# Patient Record
Sex: Female | Born: 1990 | Race: White | Hispanic: No | Marital: Married | Smoking: Former smoker
Health system: Southern US, Community
[De-identification: ages and names within clinical notes are randomized; demographics above are authoritative.]

## PROBLEM LIST (undated history)

## (undated) DIAGNOSIS — F1910 Other psychoactive substance abuse, uncomplicated: Secondary | ICD-10-CM

## (undated) DIAGNOSIS — F1020 Alcohol dependence, uncomplicated: Secondary | ICD-10-CM

## (undated) DIAGNOSIS — T7840XA Allergy, unspecified, initial encounter: Secondary | ICD-10-CM

## (undated) DIAGNOSIS — K219 Gastro-esophageal reflux disease without esophagitis: Secondary | ICD-10-CM

## (undated) DIAGNOSIS — IMO0001 Reserved for inherently not codable concepts without codable children: Secondary | ICD-10-CM

## (undated) HISTORY — DX: Other psychoactive substance abuse, uncomplicated: F19.10

## (undated) HISTORY — DX: Allergy, unspecified, initial encounter: T78.40XA

## (undated) HISTORY — DX: Reserved for inherently not codable concepts without codable children: IMO0001

## (undated) HISTORY — PX: WISDOM TOOTH EXTRACTION: SHX21

## (undated) HISTORY — PX: HERNIA REPAIR: SHX51

## (undated) HISTORY — PX: CYST EXCISION: SHX5701

## (undated) HISTORY — DX: Gastro-esophageal reflux disease without esophagitis: K21.9

## (undated) HISTORY — DX: Alcohol dependence, uncomplicated: F10.20

## (undated) HISTORY — PX: TONSILLECTOMY: SUR1361

---
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
## 2016-09-21 LAB — OB RESULTS CONSOLE HEPATITIS B SURFACE ANTIGEN
HEP B S AG: NEGATIVE
HEP B S AG: NEGATIVE

## 2016-09-21 LAB — OB RESULTS CONSOLE GC/CHLAMYDIA
Chlamydia: NEGATIVE
Gonorrhea: NEGATIVE

## 2016-09-21 LAB — OB RESULTS CONSOLE HIV ANTIBODY (ROUTINE TESTING): HIV: NONREACTIVE

## 2016-09-21 LAB — OB RESULTS CONSOLE RUBELLA ANTIBODY, IGM: Rubella: IMMUNE

## 2016-09-21 LAB — OB RESULTS CONSOLE RPR: RPR: NONREACTIVE

## 2017-01-22 LAB — OB RESULTS CONSOLE HEPATITIS B SURFACE ANTIGEN: Hepatitis B Surface Ag: NEGATIVE

## 2017-04-24 ENCOUNTER — Encounter (HOSPITAL_COMMUNITY): Payer: Self-pay

## 2017-04-24 ENCOUNTER — Inpatient Hospital Stay (HOSPITAL_COMMUNITY): Payer: 59 | Admitting: Anesthesiology

## 2017-04-24 ENCOUNTER — Inpatient Hospital Stay (HOSPITAL_COMMUNITY)
Admission: AD | Admit: 2017-04-24 | Discharge: 2017-04-26 | DRG: 807 | Disposition: A | Discharge status: Home / Self Care | Payer: 59 | Source: Ambulatory Visit | Attending: Obstetrics and Gynecology | Admitting: Obstetrics and Gynecology

## 2017-04-24 DIAGNOSIS — Z3A38 38 weeks gestation of pregnancy: Secondary | ICD-10-CM

## 2017-04-24 DIAGNOSIS — Z3483 Encounter for supervision of other normal pregnancy, third trimester: Secondary | ICD-10-CM | POA: Diagnosis present

## 2017-04-24 LAB — CBC
HCT: 33.9 % — ABNORMAL LOW (ref 36.0–46.0)
HEMOGLOBIN: 11.5 g/dL — AB (ref 12.0–15.0)
MCH: 31.3 pg (ref 26.0–34.0)
MCHC: 33.9 g/dL (ref 30.0–36.0)
MCV: 92.4 fL (ref 78.0–100.0)
PLATELETS: 265 10*3/uL (ref 150–400)
RBC: 3.67 MIL/uL — AB (ref 3.87–5.11)
RDW: 12.5 % (ref 11.5–15.5)
WBC: 8.1 10*3/uL (ref 4.0–10.5)

## 2017-04-24 LAB — TYPE AND SCREEN
ABO/RH(D): O POS
ANTIBODY SCREEN: NEGATIVE

## 2017-04-24 LAB — ABO/RH: ABO/RH(D): O POS

## 2017-04-24 LAB — POCT FERN TEST: POCT Fern Test: POSITIVE

## 2017-04-24 MED ORDER — SOD CITRATE-CITRIC ACID 500-334 MG/5ML PO SOLN: 30.0000 mL | ORAL | Status: DC | PRN

## 2017-04-24 MED ORDER — DIPHENHYDRAMINE HCL 25 MG PO CAPS: 25.0000 mg | ORAL_CAPSULE | Freq: Four times a day (QID) | ORAL | Status: DC | PRN

## 2017-04-24 MED ORDER — DIPHENHYDRAMINE HCL 50 MG/ML IJ SOLN: 12.5000 mg | INTRAMUSCULAR | Status: DC | PRN

## 2017-04-24 MED ORDER — EPHEDRINE 5 MG/ML INJ
10.0000 mg | INTRAVENOUS | Status: DC | PRN
  Filled 2017-04-24: qty 2

## 2017-04-24 MED ORDER — FLEET ENEMA 7-19 GM/118ML RE ENEM: 1.0000 | ENEMA | Freq: Every day | RECTAL | Status: DC | PRN

## 2017-04-24 MED ORDER — SENNOSIDES-DOCUSATE SODIUM 8.6-50 MG PO TABS
2.0000 | ORAL_TABLET | ORAL | Status: DC
  Administered 2017-04-24 – 2017-04-25 (×2): 2 via ORAL
  Filled 2017-04-24 (×2): qty 2

## 2017-04-24 MED ORDER — OXYTOCIN 40 UNITS IN LACTATED RINGERS INFUSION - SIMPLE MED: 2.5000 [IU]/h | INTRAVENOUS | Status: DC

## 2017-04-24 MED ORDER — OXYTOCIN 40 UNITS IN LACTATED RINGERS INFUSION - SIMPLE MED
1.0000 m[IU]/min | INTRAVENOUS | Status: DC
  Administered 2017-04-24: 2 m[IU]/min via INTRAVENOUS
  Filled 2017-04-24: qty 1000

## 2017-04-24 MED ORDER — ACETAMINOPHEN 325 MG PO TABS
650.0000 mg | ORAL_TABLET | ORAL | Status: DC | PRN
  Administered 2017-04-24: 650 mg via ORAL
  Filled 2017-04-24 (×2): qty 2

## 2017-04-24 MED ORDER — LACTATED RINGERS IV SOLN: 500.0000 mL | INTRAVENOUS | Status: DC | PRN

## 2017-04-24 MED ORDER — FENTANYL 2.5 MCG/ML BUPIVACAINE 1/10 % EPIDURAL INFUSION (WH - ANES)
14.0000 mL/h | INTRAMUSCULAR | Status: DC | PRN
  Administered 2017-04-24: 14 mL/h via EPIDURAL
  Filled 2017-04-24: qty 100

## 2017-04-24 MED ORDER — TETANUS-DIPHTH-ACELL PERTUSSIS 5-2.5-18.5 LF-MCG/0.5 IM SUSP: 0.5000 mL | Freq: Once | INTRAMUSCULAR | Status: DC

## 2017-04-24 MED ORDER — OXYTOCIN BOLUS FROM INFUSION
500.0000 mL | Freq: Once | INTRAVENOUS | Status: AC
  Administered 2017-04-24: 500 mL via INTRAVENOUS

## 2017-04-24 MED ORDER — SIMETHICONE 80 MG PO CHEW: 80.0000 mg | CHEWABLE_TABLET | ORAL | Status: DC | PRN

## 2017-04-24 MED ORDER — MEASLES, MUMPS & RUBELLA VAC ~~LOC~~ INJ
0.5000 mL | INJECTION | Freq: Once | SUBCUTANEOUS | Status: DC
  Filled 2017-04-24: qty 0.5

## 2017-04-24 MED ORDER — BISACODYL 10 MG RE SUPP: 10.0000 mg | Freq: Every day | RECTAL | Status: DC | PRN

## 2017-04-24 MED ORDER — OXYCODONE-ACETAMINOPHEN 5-325 MG PO TABS: 2.0000 | ORAL_TABLET | ORAL | Status: DC | PRN

## 2017-04-24 MED ORDER — PENICILLIN G POTASSIUM 5000000 UNITS IJ SOLR
5.0000 10*6.[IU] | Freq: Once | INTRAVENOUS | Status: AC
  Administered 2017-04-24: 5 10*6.[IU] via INTRAVENOUS
  Filled 2017-04-24: qty 5

## 2017-04-24 MED ORDER — ONDANSETRON HCL 4 MG PO TABS: 4.0000 mg | ORAL_TABLET | ORAL | Status: DC | PRN

## 2017-04-24 MED ORDER — LACTATED RINGERS IV SOLN
INTRAVENOUS | Status: DC
  Administered 2017-04-24: 05:00:00 via INTRAVENOUS
  Administered 2017-04-24: 125 mL/h via INTRAVENOUS

## 2017-04-24 MED ORDER — OXYCODONE-ACETAMINOPHEN 5-325 MG PO TABS: 1.0000 | ORAL_TABLET | ORAL | Status: DC | PRN

## 2017-04-24 MED ORDER — LACTATED RINGERS IV SOLN: 500.0000 mL | Freq: Once | INTRAVENOUS | Status: DC

## 2017-04-24 MED ORDER — OXYCODONE-ACETAMINOPHEN 5-325 MG PO TABS
1.0000 | ORAL_TABLET | ORAL | Status: DC | PRN
  Administered 2017-04-25 – 2017-04-26 (×4): 1 via ORAL
  Filled 2017-04-24 (×4): qty 1

## 2017-04-24 MED ORDER — PENICILLIN G POT IN DEXTROSE 60000 UNIT/ML IV SOLN
3.0000 10*6.[IU] | INTRAVENOUS | Status: DC
  Filled 2017-04-24 (×2): qty 50

## 2017-04-24 MED ORDER — PHENYLEPHRINE 40 MCG/ML (10ML) SYRINGE FOR IV PUSH (FOR BLOOD PRESSURE SUPPORT)
80.0000 ug | PREFILLED_SYRINGE | INTRAVENOUS | Status: DC | PRN
  Filled 2017-04-24: qty 10
  Filled 2017-04-24: qty 5

## 2017-04-24 MED ORDER — TERBUTALINE SULFATE 1 MG/ML IJ SOLN
0.2500 mg | Freq: Once | INTRAMUSCULAR | Status: DC | PRN
  Filled 2017-04-24: qty 1

## 2017-04-24 MED ORDER — PRENATAL MULTIVITAMIN CH
1.0000 | ORAL_TABLET | Freq: Every day | ORAL | Status: DC
  Administered 2017-04-24 – 2017-04-25 (×2): 1 via ORAL
  Filled 2017-04-24 (×2): qty 1

## 2017-04-24 MED ORDER — PHENYLEPHRINE 40 MCG/ML (10ML) SYRINGE FOR IV PUSH (FOR BLOOD PRESSURE SUPPORT)
80.0000 ug | PREFILLED_SYRINGE | INTRAVENOUS | Status: DC | PRN
  Administered 2017-04-24: 80 ug via INTRAVENOUS
  Filled 2017-04-24: qty 5

## 2017-04-24 MED ORDER — BENZOCAINE-MENTHOL 20-0.5 % EX AERO
1.0000 "application " | INHALATION_SPRAY | CUTANEOUS | Status: DC | PRN
  Administered 2017-04-24: 1 via TOPICAL
  Filled 2017-04-24: qty 56

## 2017-04-24 MED ORDER — ACETAMINOPHEN 325 MG PO TABS
650.0000 mg | ORAL_TABLET | ORAL | Status: DC | PRN
Start: 2017-04-24 — End: 2017-04-24

## 2017-04-24 MED ORDER — IBUPROFEN 800 MG PO TABS
800.0000 mg | ORAL_TABLET | Freq: Three times a day (TID) | ORAL | Status: DC | PRN
  Administered 2017-04-24 – 2017-04-26 (×5): 800 mg via ORAL
  Filled 2017-04-24 (×7): qty 1

## 2017-04-24 MED ORDER — COCONUT OIL OIL: 1.0000 "application " | TOPICAL_OIL | Status: DC | PRN

## 2017-04-24 MED ORDER — FLEET ENEMA 7-19 GM/118ML RE ENEM: 1.0000 | ENEMA | RECTAL | Status: DC | PRN

## 2017-04-24 MED ORDER — ONDANSETRON HCL 4 MG/2ML IJ SOLN: 4.0000 mg | Freq: Four times a day (QID) | INTRAMUSCULAR | Status: DC | PRN

## 2017-04-24 MED ORDER — WITCH HAZEL-GLYCERIN EX PADS
1.0000 "application " | MEDICATED_PAD | CUTANEOUS | Status: DC | PRN
  Administered 2017-04-25: 1 via TOPICAL

## 2017-04-24 MED ORDER — ZOLPIDEM TARTRATE 5 MG PO TABS: 5.0000 mg | ORAL_TABLET | Freq: Every evening | ORAL | Status: DC | PRN

## 2017-04-24 MED ORDER — LIDOCAINE HCL (PF) 1 % IJ SOLN
30.0000 mL | INTRAMUSCULAR | Status: DC | PRN
  Filled 2017-04-24: qty 30

## 2017-04-24 MED ORDER — LIDOCAINE HCL (PF) 1 % IJ SOLN
INTRAMUSCULAR | Status: DC | PRN
  Administered 2017-04-24: 13 mL via EPIDURAL

## 2017-04-24 MED ORDER — DIBUCAINE 1 % RE OINT: 1.0000 "application " | TOPICAL_OINTMENT | RECTAL | Status: DC | PRN

## 2017-04-24 MED ORDER — ONDANSETRON HCL 4 MG/2ML IJ SOLN: 4.0000 mg | INTRAMUSCULAR | Status: DC | PRN

## 2017-04-24 NOTE — Anesthesia Postprocedure Evaluation (Signed)
Anesthesia Post Note  Patient: Alicia Elliott  Procedure(s) Performed: AN AD HOC LABOR EPIDURAL     Patient location during evaluation: Mother Baby Anesthesia Type: Epidural Level of consciousness: awake and alert and oriented Pain management: pain level controlled Vital Signs Assessment: post-procedure vital signs reviewed and stable Respiratory status: spontaneous breathing and nonlabored ventilation Cardiovascular status: stable Postop Assessment: no headache, patient able to bend at knees, no backache, no apparent nausea or vomiting, epidural receding and adequate PO intake Anesthetic complications: no    Last Vitals:  Vitals:   04/24/17 1100 04/24/17 1200  BP: 124/70 124/69  Pulse: 92 89  Resp: 18 17  Temp: 36.9 C 37.2 C  SpO2: 100% 100%    Last Pain:  Vitals:   04/24/17 1200  TempSrc: Oral  PainSc: 0-No pain   Pain Goal:                 Land O'LakesMalinova,Alicia Elliott

## 2017-04-24 NOTE — Anesthesia Postprocedure Evaluation (Signed)
Anesthesia Post Note  Patient: Alicia RaisinCarrie Common  Procedure(s) Performed: AN AD HOC LABOR EPIDURAL     Patient location during evaluation: Mother Baby Anesthesia Type: Epidural Level of consciousness: awake Pain management: pain level controlled Vital Signs Assessment: post-procedure vital signs reviewed and stable Respiratory status: spontaneous breathing Cardiovascular status: stable Postop Assessment: no headache, no backache, epidural receding, patient able to bend at knees, no apparent nausea or vomiting and adequate PO intake Anesthetic complications: no    Last Vitals:  Vitals:   04/24/17 1100 04/24/17 1200  BP: 124/70 124/69  Pulse: 92 89  Resp: 18 17  Temp: 36.9 C 37.2 C  SpO2: 100% 100%    Last Pain:  Vitals:   04/24/17 1200  TempSrc: Oral  PainSc: 0-No pain   Pain Goal:                 [name]

## 2017-04-24 NOTE — MAU Note (Signed)
Pt states water broke at 0315-clear fluid. Pt reports contractions about every 5 mins. Pt denies vaginal bleeding. Reports good fetal movement. States she was 1.5cm last week. +GBS

## 2017-04-24 NOTE — Lactation Note (Signed)
This note was copied from a baby's chart. Lactation Consultation Note  Patient Name: Alicia Elliott: 04/24/2017 Reason for consult: Initial assessment;Early term 37-38.6wks;Other (Comment)(LC noted the baby to have a short anterior frenulum , latched with depth , and Did Not discuss with parents - worked on spoon feeding and latching . compressible areolas with some edema - se LC note )  Baby is 6 hours old  [facility]  Mom with hand expressing with good results.  Baby awake and hungry, spoon fed 2.5 ml/ and tolerated well. LC noted a short anterior frenulum.  Since the baby was able to spoon feed well and latch did not discuss tongue - tie with them.  After spoon feeding, LC had baby suck on her gloved finger, and noted the baby to also have a high palate,  Upper lip flanges well with assessment and latched at the breast. Baby intermittently was humping the back of her tongue, settled down as she got into a pattern.  Baby latched well with depth, multiple swallows noted, and increased with breast compressions.  Per mom comfortable.  LC instructed mom on the use shells due to some swelling noted at the base if the nipple when areola  Compressible. Explained to mom to wear shells between feedings when not STS, and not when sleeping.  Discussed prevention of soreness.  Also recommended prior to feeding on the 1st breast , breast massage, hand express, pre-pump to make the  Nipple and areola more compressible and elastic and then reverse pressure ( as shown )  Firm support for latching.  Mother informed of post-discharge support and given phone number to the lactation department, including services for phone call assistance; out-patient appointments; and breastfeeding support group. List of other breastfeeding resources in the community given in the handout. Encouraged mother to call for problems or concerns related to breastfeeding.  RN aware of  findings at consult and [facility].    Maternal Data Has patient been taught Hand Expression?: Yes(as LC entered the room MBURN  hand expressing 2.5 , LC spoon fed baby ) Does the patient have breastfeeding experience prior to this delivery?: No  Feeding Feeding Type: Breast Fed Length of feed: 7 min(multiple swallows noted , increased w/ breast compressions )  LATCH Score Latch: Grasps breast easily, tongue down, lips flanged, rhythmical sucking.(worked on depth )  Audible Swallowing: Spontaneous and intermittent  Type of Nipple: Everted at rest and after stimulation  Comfort (Breast/Nipple): Soft / non-tender  Hold (Positioning): Assistance needed to correctly position infant at breast and maintain latch.  LATCH Score: 9  Interventions Interventions: Breast feeding basics reviewed;Assisted with latch;Skin to skin;Breast massage;Hand express;Pre-pump if needed;Breast compression;Adjust position;Support pillows;Position options;Shells;Hand pump  Lactation Tools Discussed/Used Tools: Shells;Pump Shell Type: Inverted Breast pump type: Manual Pump Review: Setup, frequency, and cleaning Initiated by:: MAI - after breast massage , hand express, pre-pump to make the nipple / areola complex more elastic to prevent soreness  Elliott initiated:: 04/24/17   Consult Status Consult Status: Follow-up Elliott: 04/25/17 Follow-up type: In-patient    Matilde SprangMargaret Ann Stefanie Hodgens 04/24/2017, 4:05 PM

## 2017-04-24 NOTE — L&D Delivery Note (Signed)
Delivery Note At 9:11 AM a viable female was delivered via Vaginal, Spontaneous (Presentation:VTX>>OA ;  ).  APGAR: 9, 9; weight  .   Placenta status:SPONT>>INTACT , .  Cord:  with the following complications: .  Cord pH: not sent  Anesthesia:  epid Episiotomy: None Lacerations: Periurethral Suture Repair: 3.0 vicryl rapide Est. Blood Loss (mL):   300 Mom to postpartum.  Baby to Couplet care / Skin to Skin.  [name] [name] Adrianne Shackleton 04/24/2017, 9:36 AM

## 2017-04-24 NOTE — H&P (Signed)
Alicia Elliott is a 27 y.o. female presenting for SROM + labor. OB History    Gravida Para Term Preterm AB Living   2       1     SAB TAB Ectopic Multiple Live Births   1             History reviewed. No pertinent past medical history. Past Surgical History:  Procedure Laterality Date  . CYST EXCISION    . HERNIA REPAIR    . TONSILLECTOMY    . WISDOM TOOTH EXTRACTION     Family History: family history is not on file. Social History:  reports that  has never smoked. she has never used smokeless tobacco. She reports that she does not drink alcohol or use drugs.     Maternal Diabetes: No Genetic Screening: Normal Maternal Ultrasounds/Referrals: Normal Fetal Ultrasounds or other Referrals:  None Maternal Substance Abuse:  No Significant Maternal Medications:  None Significant Maternal Lab Results:  None Other Comments:  None  ROS History Dilation: (P) 10 Effacement (%): 100 Station: (P) +1, +2 Exam by:: (P) Alicia Elliott Blood pressure (!) 97/48, pulse 88, temperature 97.7 F (36.5 C), temperature source Oral, resp. rate 18, height 5\' 7"  (1.702 m), weight 169 lb (76.7 kg), SpO2 99 %. Exam Physical Exam  Constitutional: She is oriented to person, place, and time. She appears well-developed and well-nourished.  HENT:  Head: Normocephalic and atraumatic.  Neck: Normal range of motion. Neck supple.  Cardiovascular: Normal rate and regular rhythm.  Respiratory: Effort normal and breath sounds normal.  GI:  Term FH, FHR 142  Genitourinary:  Genitourinary Comments: Now C/C/+2  Musculoskeletal: Normal range of motion.  Neurological: She is alert and oriented to person, place, and time.  Skin: Skin is dry.    Prenatal labs: ABO, Rh: --/--/O POS (01/01 [id number]) Antibody: NEG (01/01 0438) Rubella:   RPR:    HBsAg:    HIV:    GBS:     Assessment/Plan: Term IUP, labor   Alicia Elliott 04/24/2017, 8:30 AM

## 2017-04-24 NOTE — Anesthesia Pain Management Evaluation Note (Signed)
  CRNA Pain Management Visit Note  Patient: Alicia Elliott, 27 y.o., female  "Hello I am a member of the anesthesia team at [facility]. We have an anesthesia team available at all times to provide care throughout the hospital, including epidural management and anesthesia for C-section. I don't know your plan for the delivery whether it a natural birth, water birth, IV sedation, nitrous supplementation, doula or epidural, but we want to meet your pain goals."   1.Was your pain managed to your expectations on prior hospitalizations?   Yes   2.What is your expectation for pain management during this hospitalization?     Epidural  3.How can we help you reach that goal? Verbalizes epidural  Record the patient's initial score and the patient's pain goal.   Pain: 1  Pain Goal: 6 [facility] wants you to be able to say your pain was always managed very well.  Mauricia AreaWINN,Ceili Boshers 04/24/2017

## 2017-04-24 NOTE — Anesthesia Procedure Notes (Signed)
Epidural Patient location during procedure: OB Start time: 04/24/2017 6:26 AM End time: 04/24/2017 6:41 AM  Staffing Anesthesiologist: Lowella CurbMiller, Fleming Prill Ray, MD Performed: anesthesiologist   Preanesthetic Checklist Completed: patient identified, site marked, surgical consent, pre-op evaluation, timeout performed, IV checked, risks and benefits discussed and monitors and equipment checked  Epidural Patient position: sitting Prep: ChloraPrep Patient monitoring: heart rate, cardiac monitor, continuous pulse ox and blood pressure Approach: midline Location: L2-L3 Injection technique: LOR saline  Needle:  Needle type: Tuohy  Needle gauge: 17 G Needle length: 9 cm Needle insertion depth: 5 cm Catheter type: closed end flexible Catheter size: 20 Guage Catheter at skin depth: 9 cm Test dose: negative  Assessment Events: blood not aspirated, injection not painful, no injection resistance, negative IV test and no paresthesia  Additional Notes Reason for block:procedure for pain

## 2017-04-24 NOTE — Anesthesia Preprocedure Evaluation (Signed)
Anesthesia Evaluation  Patient identified by MRN, date of birth, ID band Patient awake    Reviewed: Allergy & Precautions, NPO status , Patient's Chart, lab work & pertinent test results  Airway Mallampati: II  TM Distance: >3 FB Neck ROM: Full    Dental no notable dental hx.    Pulmonary neg pulmonary ROS,    Pulmonary exam normal breath sounds clear to auscultation       Cardiovascular negative cardio ROS Normal cardiovascular exam Rhythm:Regular Rate:Normal     Neuro/Psych negative neurological ROS  negative psych ROS   GI/Hepatic negative GI ROS, Neg liver ROS,   Endo/Other  negative endocrine ROS  Renal/GU negative Renal ROS  negative genitourinary   Musculoskeletal negative musculoskeletal ROS (+)   Abdominal   Peds negative pediatric ROS (+)  Hematology negative hematology ROS (+)   Anesthesia Other Findings   Reproductive/Obstetrics negative OB ROS (+) Pregnancy                             Anesthesia Physical Anesthesia Plan  ASA: II  Anesthesia Plan: Epidural   Post-op Pain Management:    Induction:   PONV Risk Score and Plan:   Airway Management Planned:   Additional Equipment:   Intra-op Plan:   Post-operative Plan:   Informed Consent: I have reviewed the patients History and Physical, chart, labs and discussed the procedure including the risks, benefits and alternatives for the proposed anesthesia with the patient or authorized representative who has indicated his/her understanding and acceptance.     Plan Discussed with: CRNA  Anesthesia Plan Comments:         Anesthesia Quick Evaluation  

## 2017-04-24 NOTE — Plan of Care (Signed)
POC discussed with pt and support people.

## 2017-04-25 LAB — CBC
HEMATOCRIT: 31.9 % — AB (ref 36.0–46.0)
Hemoglobin: 10.7 g/dL — ABNORMAL LOW (ref 12.0–15.0)
MCH: 31.1 pg (ref 26.0–34.0)
MCHC: 33.5 g/dL (ref 30.0–36.0)
MCV: 92.7 fL (ref 78.0–100.0)
PLATELETS: 226 10*3/uL (ref 150–400)
RBC: 3.44 MIL/uL — ABNORMAL LOW (ref 3.87–5.11)
RDW: 12.8 % (ref 11.5–15.5)
WBC: 9 10*3/uL (ref 4.0–10.5)

## 2017-04-25 NOTE — Lactation Note (Signed)
This note was copied from a baby's chart. Lactation Consultation Note  Patient Name: Girl Bernita RaisinCarrie Schoeppner WUJWJ'XToday's Date: 04/25/2017 Reason for consult: Follow-up assessment;Infant weight loss;Early term 37-38.6wks(3% weight loss, MBURN presently6 doing the 24 heart screen )  Visitors waiting to see mom , dad and baby.  LC will F/U with mom today.  LC reviewed and updated the doc flow sheets. - per mom     Maternal Data    Feeding    LATCH Score                   Interventions Interventions: Breast feeding basics reviewed  Lactation Tools Discussed/Used     Consult Status Consult Status: Follow-up Date: 04/25/17 Follow-up type: In-patient    Matilde SprangMargaret Ann Pal Shell 04/25/2017, 11:28 AM

## 2017-04-25 NOTE — Progress Notes (Signed)
Post Partum Day 1 Subjective: no complaints, up ad lib, voiding and tolerating PO  Objective: Blood pressure 106/64, pulse 84, temperature 98.5 F (36.9 C), temperature source Oral, resp. rate 18, height 5\' 7"  (1.702 m), weight 76.7 kg (169 lb), SpO2 98 %, unknown if currently breastfeeding.  Physical Exam:  General: alert, cooperative and appears stated age Lochia: appropriate Uterine Fundus: firm Incision: N/A DVT Evaluation: No evidence of DVT seen on physical exam. Negative Homan's sign. No cords or calf tenderness. No significant calf/ankle edema.  Recent Labs    04/24/17 0436 04/25/17 0614  HGB 11.5* 10.7*  HCT 33.9* 31.9*    Assessment/Plan: Plan for discharge tomorrow and Breastfeeding   LOS: 1 day   Ranae Pilalise Jennifer Jaasia Viglione 04/25/2017, 8:52 AM

## 2017-04-25 NOTE — Lactation Note (Signed)
This note was copied from a baby's chart. Lactation Consultation Note  Patient Name: Alicia Bernita RaisinCarrie Huy WUJWJ'XToday's Date: 04/25/2017 Reason for consult: Follow-up assessment  3rd  visit to for this LC.  Baby has been sluggish this am for latching.  And LC attempted around 1245 and unable to latch the baby / attempted, and spoon fed 1 ml.  Baby slept for 1 hour and LC went back to assist mom.  Baby to fussy to latch STS , and LC started a NS ( #24 NS was the better fit)  Baby latched well with depth with the #24 NS with multiple swallows, increased  With breast compressions. Baby fed for 15 mins and milk in the NS after the baby finished.  LC assisted to latch on the left breast / cross cradle and baby fed only 5 mins and off few swallows.  Baby content after feeding.  LC set up with a DEBP and the #24 Flange was comfortable for mom and appeared to be.  Mom was pumping as the LC was finishing consult.  Mom and grandma pleased with the outcome of the feeding.   LC Plan -  Breast shells between feedings when the baby isn't STS, and not at sleep Prior to latch- breast massage, hand express, pre-pump with hand pump if needed .  Try latching STS, if not successful, apply NS, instill EBM in the top to the NS and  Latch with firm support.  Feed the baby for at least 15- 20 mins and if the baby is in a consistent pattern let the  Baby finished, and offer 2nd breast.  Post pump both breast for 15 -20 mins , save milk until next feeding.  [facility]   LC pointed out to mom and grandma about the short anterior.  After the baby fed with a NS stretched the tongue better over the gumline.    Maternal Data Has patient been taught Hand Expression?: Yes  Feeding Feeding Type: Breast Fed Length of feed: 15 min(multiple swallows, inc with compressions, milk in the NS )  LATCH Score Latch: Grasps breast easily, tongue down, lips flanged, rhythmical sucking.  Audible Swallowing:  Spontaneous and intermittent  Type of Nipple: Everted at rest and after stimulation(NS used for High Palate and being so fussy  to latch )  Comfort (Breast/Nipple): Soft / non-tender  Hold (Positioning): Assistance needed to correctly position infant at breast and maintain latch.  LATCH Score: 9  Interventions Interventions: Breast feeding basics reviewed;Shells;DEBP  Lactation Tools Discussed/Used Tools: Pump Nipple shield size: 24;20;Other (comment)(sized for #20 and #24 NS , and the #24 NS better fit ) Shell Type: Inverted Breast pump type: Double-Electric Breast Pump WIC Program: No Pump Review: Setup, frequency, and cleaning Initiated by:: MAI  Date initiated:: 04/25/17   Consult Status Consult Status: Follow-up Date: 04/26/17 Follow-up type: In-patient    Alicia Elliott 04/25/2017, 3:44 PM

## 2017-04-25 NOTE — Lactation Note (Signed)
This note was copied from a baby's chart. Lactation Consultation Note  Patient Name: Alicia Bernita RaisinCarrie Mansour ONGEX'BToday's Date: 04/25/2017 Reason for consult: Follow-up assessment;Early term 37-38.6wks;Infant weight loss  Baby is 28 hours old  LC updated the 1230 attempt in doc flow sheets.  As LC entered the room dad holding baby, baby sleepy and due to the last feeding at 610 am  And now it is 1245, reviewed hand expressing with 1 ml results , spoon fed to baby.  LC assisted to attempt to latch on the left breast cross cradle, on and off pattern few sucks and swallows.  Baby did not sustain depth at the breast.  LC shifted the baby's position to football on the right breast, and the baby latched for a liittle bit longer than the left breast.  LC suspects the baby is an evening / night baby. LC recommended for now STS, let her sleep, and try again in an hour.  Since the baby is 7128 hours old, LC explored the options with mom and dad , possible to use the NS to latch, also start a DEBP for post pumping so milk is available if needed.     Maternal Data Has patient been taught Hand Expression?: Yes  Feeding Feeding Type: Breast Milk Length of feed: 5 min(per mom on and off , did not stay on. )  LATCH Score                   Interventions Interventions: Breast feeding basics reviewed;Skin to skin  Lactation Tools Discussed/Used     Consult Status Consult Status: Follow-up Date: 04/25/17 Follow-up type: In-patient    Alicia Elliott 04/25/2017, 1:24 PM

## 2017-04-26 LAB — SYPHILIS: RPR W/REFLEX TO RPR TITER AND TREPONEMAL ANTIBODIES, TRADITIONAL SCREENING AND DIAGNOSIS ALGORITHM: RPR Ser Ql: NONREACTIVE

## 2017-04-26 MED ORDER — ACETAMINOPHEN 325 MG PO TABS: 650.0000 mg | ORAL_TABLET | Freq: Four times a day (QID) | ORAL | 0 refills | Status: AC | PRN

## 2017-04-26 MED ORDER — BENZOCAINE-MENTHOL 20-0.5 % EX AERO: 1.0000 "application " | INHALATION_SPRAY | Freq: Four times a day (QID) | CUTANEOUS | 1 refills | Status: DC | PRN

## 2017-04-26 MED ORDER — IBUPROFEN 200 MG PO TABS: 600.0000 mg | ORAL_TABLET | Freq: Four times a day (QID) | ORAL | 0 refills | Status: DC | PRN

## 2017-04-26 MED ORDER — OXYCODONE HCL 5 MG PO TABS: 5.0000 mg | ORAL_TABLET | Freq: Four times a day (QID) | ORAL | 0 refills | Status: DC | PRN

## 2017-04-26 NOTE — Progress Notes (Signed)
Post Partum Day 2 Subjective: no complaints, up ad lib, voiding, tolerating PO and + flatus  Objective: Blood pressure 116/63, pulse 78, temperature 98.3 F (36.8 C), temperature source Oral, resp. rate 18, height 5\' 7"  (1.702 m), weight 169 lb (76.7 kg), SpO2 99 %, unknown if currently breastfeeding.  Physical Exam:  General: alert, cooperative and no distress Lochia: appropriate Uterine Fundus: firm Incision: healing well DVT Evaluation: No evidence of DVT seen on physical exam.  Recent Labs    04/24/17 0436 04/25/17 0614  HGB 11.5* 10.7*  HCT 33.9* 31.9*    Assessment/Plan: Discharge home  Carefully D/W patient pain relief and her history of narcotic abuse She states she needs some narcotic for pain relief-will prescribe limited amount of oxycodone-she states she and her husband have discussed this and he is involved in monitoring her use   LOS: 2 days   Alicia LocusJames E Deric Bocock II 04/26/2017, 7:56 AM

## 2017-04-26 NOTE — Lactation Note (Signed)
This note was copied from a baby's chart. Lactation Consultation Note  Patient Name: Alicia Elliott OZHYQ'MToday's Date: 04/26/2017  Mom states baby is feeding well with a 24 mm nipple shield.  Post pumping 20 mls and filling shield with expressed milk.  Discharge instructions given including engorgement treatment.  No questions at this time.  Mom has a DEBP for home use.  Instructed to continue to post pump 4-6 times per day.  Lactation outpt. Services and support information reviewed and encouraged.   Maternal Data    Feeding Feeding Type: Breast Fed Length of feed: 15 min  LATCH Score                   Interventions    Lactation Tools Discussed/Used     Consult Status      Huston FoleyMOULDEN, [name] 04/26/2017, 9:55 AM

## 2017-04-26 NOTE — Discharge Summary (Signed)
Obstetric Discharge Summary Reason for Admission: onset of labor Prenatal Procedures: none Intrapartum Procedures: spontaneous vaginal delivery Postpartum Procedures: none Complications-Operative and Postpartum: none Hemoglobin  Date Value Ref Range Status  04/25/2017 10.7 (L) 12.0 - 15.0 g/dL Final   HCT  Date Value Ref Range Status  04/25/2017 31.9 (L) 36.0 - 46.0 % Final    Physical Exam:  General: alert, cooperative and no distress Lochia: appropriate Uterine Fundus: firm Incision: healing well DVT Evaluation: No evidence of DVT seen on physical exam.  Discharge Diagnoses: Term Pregnancy-delivered  Discharge Information: Date: 04/26/2017 Activity: pelvic rest Diet: routine Medications: PNV, Ibuprofen and oxycodone Condition: stable Instructions: refer to practice specific booklet Discharge to: home   Newborn Data: Live born female  Birth Weight: 6 lb 8.9 oz (2975 g) APGAR: 9, 9  Newborn Delivery   Birth date/time:  04/24/2017 09:11:00 Delivery type:  Vaginal, Spontaneous     Home with mother.  Alicia Elliott 04/26/2017, 7:59 AM

## 2017-09-24 ENCOUNTER — Encounter: Payer: Self-pay | Admitting: Gastroenterology

## 2017-10-03 ENCOUNTER — Encounter: Payer: Self-pay | Admitting: Gastroenterology

## 2017-10-03 ENCOUNTER — Other Ambulatory Visit (INDEPENDENT_AMBULATORY_CARE_PROVIDER_SITE_OTHER): Payer: 59

## 2017-10-03 ENCOUNTER — Encounter

## 2017-10-03 ENCOUNTER — Ambulatory Visit (INDEPENDENT_AMBULATORY_CARE_PROVIDER_SITE_OTHER): Payer: 59 | Admitting: Gastroenterology

## 2017-10-03 ENCOUNTER — Encounter (INDEPENDENT_AMBULATORY_CARE_PROVIDER_SITE_OTHER): Payer: Self-pay

## 2017-10-03 VITALS — BP 102/68 | HR 75 | Ht 67.0 in | Wt 137.8 lb

## 2017-10-03 DIAGNOSIS — R110 Nausea: Secondary | ICD-10-CM | POA: Diagnosis not present

## 2017-10-03 DIAGNOSIS — R1013 Epigastric pain: Secondary | ICD-10-CM | POA: Insufficient documentation

## 2017-10-03 LAB — COMPREHENSIVE METABOLIC PANEL
ALBUMIN: 4.7 g/dL (ref 3.5–5.2)
ALT: 12 U/L (ref 0–35)
AST: 16 U/L (ref 0–37)
Alkaline Phosphatase: 46 U/L (ref 39–117)
BILIRUBIN TOTAL: 0.4 mg/dL (ref 0.2–1.2)
BUN: 13 mg/dL (ref 6–23)
CALCIUM: 9.7 mg/dL (ref 8.4–10.5)
CO2: 28 mEq/L (ref 19–32)
Chloride: 104 mEq/L (ref 96–112)
Creatinine, Ser: 0.91 mg/dL (ref 0.40–1.20)
GFR: 78.15 mL/min (ref >60.00)
Glucose, Bld: 85 mg/dL (ref 70–99)
Potassium: 4 mEq/L (ref 3.5–5.1)
Sodium: 139 mEq/L (ref 135–145)
Total Protein: 7.2 g/dL (ref 6.0–8.3)

## 2017-10-03 LAB — TSH: TSH: 1.1 u[IU]/mL (ref 0.35–4.50)

## 2017-10-03 LAB — CBC WITH DIFFERENTIAL/PLATELET
BASOS ABS: 0.1 10*3/uL (ref 0.0–0.1)
Basophils Relative: 1.6 % (ref 0.0–3.0)
EOS ABS: 0.8 10*3/uL — AB (ref 0.0–0.7)
Eosinophils Relative: 16.1 % — ABNORMAL HIGH (ref 0.0–5.0)
HEMATOCRIT: 39.8 % (ref 36.0–46.0)
HEMOGLOBIN: 13.5 g/dL (ref 12.0–15.0)
LYMPHS PCT: 44.6 % (ref 12.0–46.0)
Lymphs Abs: 2.1 10*3/uL (ref 0.7–4.0)
MCHC: 33.9 g/dL (ref 30.0–36.0)
MCV: 91.7 fl (ref 78.0–100.0)
Monocytes Absolute: 0.3 10*3/uL (ref 0.1–1.0)
Monocytes Relative: 6.5 % (ref 3.0–12.0)
Neutro Abs: 1.5 10*3/uL (ref 1.4–7.7)
Neutrophils Relative %: 31.2 % — ABNORMAL LOW (ref 43.0–77.0)
PLATELETS: 257 10*3/uL (ref 150.0–400.0)
RBC: 4.34 Mil/uL (ref 3.87–5.11)
RDW: 12.4 % (ref 11.5–15.5)
WBC: 4.7 10*3/uL (ref 4.0–10.5)

## 2017-10-03 MED ORDER — PANTOPRAZOLE SODIUM 40 MG PO TBEC: 40.0000 mg | DELAYED_RELEASE_TABLET | Freq: Every day | ORAL | 1 refills | Status: DC

## 2017-10-03 NOTE — Patient Instructions (Signed)
Your provider has requested that you go to the basement level for lab work before leaving today. Press "B" on the elevator. The lab is located at the first door on the left as you exit the elevator.  We have sent the following medications to your pharmacy for you to pick up at your convenience: pantoprazole.  You have been scheduled for an abdominal ultrasound at [facility] (1st floor of hospital) on 10/08/17 at 11:30am. Please arrive 15 minutes prior to your appointment for registration. Make certain not to have anything to eat or drink 6 hours prior to your appointment. Should you need to reschedule your appointment, please contact radiology at [phone]. This test typically takes about 30 minutes to perform.  Call our office back in 6 weeks (ask for Patty, RN) with an update on your symptoms.

## 2017-10-03 NOTE — Progress Notes (Addendum)
10/03/2017 Alicia Elliott [id number] 1991-01-03   HISTORY OF PRESENT ILLNESS:  This is a pleasant 27 year old female who is new to our office.  She presents here today as a self-referral with complaints of epigastric abdominal pain.  She tells me that for the past several weeks she has been experiencing epigastric abdominal pain that she describes as a burning ache.  Never had upper abdominal pain in the past so it is definitely something different and new.  Also notices increased burping.  No overt reflux.  Has nausea at times when pain is more severe.  Seems to have some degree of discomfort every day but some days it is definitely worse.  When pain is there is seems to be worse with eating and usually gets worse when she eats eggs.  Takes rare NSAID's.  Admits to occasional diarrhea, but only very sporadic and never an issue that she thought she needed to seek evaluation for.  No rectal bleeding or black stools.    Just of note, but she says that the pain seemed to start around the time that she had her IUD placed.  She says that she has not done well with IUD overall and is actually having it removed tomorrow.     Past Medical History:  Diagnosis Date  . Alcoholism /alcohol abuse ([facility])    sober since 2015   Past Surgical History:  Procedure Laterality Date  . CYST EXCISION    . HERNIA REPAIR    . TONSILLECTOMY    . WISDOM TOOTH EXTRACTION      reports that she has quit smoking. She has never used smokeless tobacco. She reports that she drank alcohol. She reports that she does not use drugs. family history includes Diabetes in her father; Diverticulitis in her father. No Known Allergies    Outpatient Encounter Medications as of 10/03/2017  Medication Sig  . acetaminophen (TYLENOL) 325 MG tablet Take 2 tablets (650 mg total) by mouth every 6 (six) hours as needed (for pain scale < 4).  . ibuprofen (ADVIL) 200 MG tablet Take 3 tablets (600 mg total) by mouth every 6 (six) hours as  needed.  . Prenatal Vit-Fe Fumarate-FA (PRENATAL MULTIVITAMIN) TABS tablet Take 1 tablet by mouth daily at 12 noon.  . [DISCONTINUED] benzocaine-Menthol (DERMOPLAST) 20-0.5 % AERO Apply 1 application topically 4 (four) times daily as needed for irritation (perineal discomfort).  . [DISCONTINUED] oxyCODONE (ROXICODONE) 5 MG immediate release tablet Take 1 tablet (5 mg total) by mouth every 6 (six) hours as needed for severe pain.   No facility-administered encounter medications on file as of 10/03/2017.      REVIEW OF SYSTEMS  : All other systems reviewed and negative except where noted in the History of Present Illness.   PHYSICAL EXAM: BP 102/68   Pulse 75   Ht 5\' 7"  (1.702 m)   Wt 137 lb 12.8 oz (62.5 kg)   Breastfeeding? No   BMI 21.58 kg/m  General: Well developed white female in no acute distress Head: Normocephalic and atraumatic Eyes:  Sclerae anicteric, conjunctiva pink. Ears: Normal auditory acuity Lungs: Clear throughout to auscultation; no increased WOB. Heart: Regular rate and rhythm; no M/R/G. Abdomen: Soft, non-distended.  BS present.  Mild epigastric TTP. Musculoskeletal: Symmetrical with no gross deformities  Skin: No lesions on visible extremities Extremities: No edema  Neurological: Alert oriented x 4, grossly non-focal Psychological:  Alert and cooperative. Normal mood and affect  ASSESSMENT AND PLAN: *27 year  old female with complaints of epigastric abdominal pain and some intermittent nausea.  Will place her on daily PPI in the form of pantoprazole 40 mg daily for 4-6 weeks to treat GERD, ulcer, etc.  Will also check an abdominal ultrasound although I think gallbladder issue sounds less likely.  Doubt that this is related to her IUD but she is having that removed tomorrow anyway as she has not done well with it.  Will check CBC, CMP, and TSH as well since she has not had any basic labs.  She will call back in about 6 weeks to give us an update on her  symptoms.  Agree with [name]'s management.  Iva Booparl E. Gessner, MD, Clementeen GrahamFACG

## 2017-10-08 ENCOUNTER — Encounter: Payer: Self-pay | Admitting: Gastroenterology

## 2017-10-08 ENCOUNTER — Ambulatory Visit (HOSPITAL_COMMUNITY)
Admission: RE | Admit: 2017-10-08 | Discharge: 2017-10-08 | Disposition: A | Discharge status: Home / Self Care | Payer: 59 | Source: Ambulatory Visit | Attending: Gastroenterology | Admitting: Gastroenterology

## 2017-10-08 DIAGNOSIS — R110 Nausea: Secondary | ICD-10-CM | POA: Diagnosis present

## 2017-10-08 DIAGNOSIS — R1013 Epigastric pain: Secondary | ICD-10-CM | POA: Insufficient documentation

## 2017-11-07 ENCOUNTER — Telehealth: Payer: Self-pay | Admitting: Gastroenterology

## 2017-11-07 NOTE — Telephone Encounter (Signed)
Sounds like it is acid related.  How is her pain?  Next step would be EGD if she is concerned and interested.

## 2017-11-07 NOTE — Telephone Encounter (Signed)
Error

## 2017-11-07 NOTE — Telephone Encounter (Signed)
The pt is calling with an update on her symptoms.  She is taking protonix 40 mg daily for a month and the nausea has gotten better but has not resolved.  She missed protonix a couple times and the nausea did get worse.  Alicia PebblesJessica FYI please advise on further recommendations.

## 2017-11-07 NOTE — Telephone Encounter (Signed)
The pt has been scheduled for previsit and EGD with Dr Leone PayorGessner.  The pt has been advised of the information and verbalized understanding.

## 2017-12-21 ENCOUNTER — Ambulatory Visit (AMBULATORY_SURGERY_CENTER): Payer: Self-pay

## 2017-12-21 VITALS — Ht 67.0 in | Wt 135.8 lb

## 2017-12-21 DIAGNOSIS — R1013 Epigastric pain: Secondary | ICD-10-CM

## 2017-12-21 NOTE — Progress Notes (Signed)
Denies allergies to eggs or soy products. Denies complication of anesthesia or sedation. Denies use of weight loss medication. Denies use of O2.   Emmi instructions declined.  

## 2017-12-27 ENCOUNTER — Encounter: Payer: Self-pay | Admitting: Internal Medicine

## 2017-12-27 ENCOUNTER — Ambulatory Visit (AMBULATORY_SURGERY_CENTER): Payer: 59 | Admitting: Internal Medicine

## 2017-12-27 VITALS — BP 110/67 | HR 57 | Temp 99.1°F | Resp 11 | Ht 67.0 in | Wt 135.0 lb

## 2017-12-27 DIAGNOSIS — R1013 Epigastric pain: Secondary | ICD-10-CM | POA: Diagnosis not present

## 2017-12-27 DIAGNOSIS — K209 Esophagitis, unspecified: Secondary | ICD-10-CM | POA: Diagnosis not present

## 2017-12-27 MED ORDER — ESOMEPRAZOLE MAGNESIUM 40 MG PO CPDR: 40.0000 mg | DELAYED_RELEASE_CAPSULE | Freq: Every day | ORAL | 0 refills | Status: DC

## 2017-12-27 MED ORDER — SODIUM CHLORIDE 0.9 % IV SOLN: 500.0000 mL | Freq: Once | INTRAVENOUS | Status: DC

## 2017-12-27 NOTE — Progress Notes (Signed)
Called to room to assist during endoscopic procedure.  Patient ID and intended procedure confirmed with present staff. Received instructions for my participation in the procedure from the performing physician.  

## 2017-12-27 NOTE — Patient Instructions (Signed)
YOU HAD AN ENDOSCOPIC PROCEDURE TODAY AT THE [facility]:   Refer to the procedure report that was given to you for any specific questions about what was found during the examination.  If the procedure report does not answer your questions, please call your gastroenterologist to clarify.  If you requested that your care partner not be given the details of your procedure findings, then the procedure report has been included in a sealed envelope for you to review at your convenience later.  YOU SHOULD EXPECT: Some feelings of bloating in the abdomen. Passage of more gas than usual.  Walking can help get rid of the air that was put into your GI tract during the procedure and reduce the bloating. If you had a lower endoscopy (such as a colonoscopy or flexible sigmoidoscopy) you may notice spotting of blood in your stool or on the toilet paper. If you underwent a bowel prep for your procedure, you may not have a normal bowel movement for a few days.  Please Note:  You might notice some irritation and congestion in your nose or some drainage.  This is from the oxygen used during your procedure.  There is no need for concern and it should clear up in a day or so.  SYMPTOMS TO REPORT IMMEDIATELY:   Following upper endoscopy (EGD)  Vomiting of blood or coffee ground material  New chest pain or pain under the shoulder blades  Painful or persistently difficult swallowing  New shortness of breath  Fever of 100F or higher  Black, tarry-looking stools  For urgent or emergent issues, a gastroenterologist can be reached at any hour by calling [phone].   DIET:  We do recommend a small meal at first, but then you may proceed to your regular diet.  Drink plenty of fluids but you should avoid alcoholic beverages for 24 hours.  MEDICATIONS: Continue present medications. Try esomeprazole 40 mg by mouth daily. Pantoprazole did not help.  Please see handouts given to you by your recovery  nurse.  ACTIVITY:  You should plan to take it easy for the rest of today and you should NOT DRIVE or use heavy machinery until tomorrow (because of the sedation medicines used during the test).    FOLLOW UP: Our staff will call the number listed on your records the next business day following your procedure to check on you and address any questions or concerns that you may have regarding the information given to you following your procedure. If we do not reach you, we will leave a message.  However, if you are feeling well and you are not experiencing any problems, there is no need to return our call.  We will assume that you have returned to your regular daily activities without incident.  If any biopsies were taken you will be contacted by phone or by letter within the next 1-3 weeks.  Please call us at [phone] if you have not heard about the biopsies in 3 weeks.   Thank you for allowing [place] to provide for your healthcare needs today.  SIGNATURES/CONFIDENTIALITY: You and/or your care partner have signed paperwork which will be entered into your electronic medical record.  These signatures attest to the fact that that the information above on your After Visit Summary has been reviewed and is understood.  Full responsibility of the confidentiality of this discharge information lies with you and/or your care-partner.

## 2017-12-27 NOTE — Progress Notes (Signed)
Report given to PACU, vss 

## 2017-12-27 NOTE — Op Note (Signed)
[facility] Patient Name: Alicia Elliott Procedure Date: 12/27/2017 3:51 PM MRN: [id number] Endoscopist: Iva Boop , MD Age: 27 Referring MD:  Date of Birth: 12/18/1990 Gender: Female Account #: [id number] Procedure:                Upper GI endoscopy Medicines:                Propofol per Anesthesia, Monitored Anesthesia Care Procedure:                Pre-Anesthesia Assessment:                           - Prior to the procedure, a History and Physical                            was performed, and patient medications and                            allergies were reviewed. The patient's tolerance of                            previous anesthesia was also reviewed. The risks                            and benefits of the procedure and the sedation                            options and risks were discussed with the patient.                            All questions were answered, and informed consent                            was obtained. Prior Anticoagulants: The patient has                            taken no previous anticoagulant or antiplatelet                            agents. ASA Grade Assessment: I - A normal, healthy                            patient. After reviewing the risks and benefits,                            the patient was deemed in satisfactory condition to                            undergo the procedure.                           After obtaining informed consent, the endoscope was                            passed under direct vision. Throughout  the                            procedure, the patient's blood pressure, pulse, and                            oxygen saturations were monitored continuously. The                            Model GIF-[id number]) scope was introduced                            through the mouth, and advanced to the second part                            of duodenum. The upper GI endoscopy was   accomplished without difficulty. The patient                            tolerated the procedure well. Scope In: Scope Out: Findings:                 LA Grade A (one or more mucosal breaks less than 5                            mm, not extending between tops of 2 mucosal folds)                            esophagitis with no bleeding was found at the                            gastroesophageal junction.                           The exam was otherwise without abnormality.                           The cardia and gastric fundus were normal on                            retroflexion.                           Biopsies were taken with a cold forceps in the                            gastric antrum for Helicobacter pylori testing                            using CLOtest. Verification of patient                            identification for the specimen was done. Estimated                            blood loss was minimal.  Complications:            No immediate complications. Estimated Blood Loss:     Estimated blood loss was minimal. Impression:               - LA Grade A reflux esophagitis.                           - The examination was otherwise normal.                           - Biopsies were taken with a cold forceps for                            Helicobacter pylori testing using CLOtest. Recommendation:           - Patient has a contact number available for                            emergencies. The signs and symptoms of potential                            delayed complications were discussed with the                            patient. Return to normal activities tomorrow.                            Written discharge instructions were provided to the                            patient.                           - Resume previous diet.                           - Continue present medications.                           - Await pathology results.                           - Await CLO  test                           Try esomeprazole 40 mg daily # 90 no refill (rxed)                           pantoprazole did not help - she does have                            esophagitis so suspect GERD Iva Boop, MD 12/27/2017 4:32:56 PM This report has been signed electronically.

## 2017-12-27 NOTE — Progress Notes (Signed)
Pt's states no medical or surgical changes since previsit or office visit. 

## 2017-12-28 ENCOUNTER — Telehealth: Payer: Self-pay

## 2017-12-28 NOTE — Telephone Encounter (Signed)
  Follow up Call-  Call back number 12/27/2017  Post procedure Call Back phone  # #[phone] cell  Permission to leave phone message Yes  Some recent data might be hidden     Patient questions:  Do you have a fever, pain , or abdominal swelling? Yes.   Pain Score  4 *  Have you tolerated food without any problems? Yes.    Have you been able to return to your normal activities? Yes.    Do you have any questions about your discharge instructions: Diet   No. Medications  No. Follow up visit  No.  Do you have questions or concerns about your Care? No.  Actions: * If pain score is 4 or above:  Pt. Reports she does have "a tummy ache." Reports she does have a sensitive tummy, and is not concerned about it.  Told pt. If she does have questions or concerns to call us. No action needed, pain <4.

## 2018-01-04 LAB — HELICOBACTER PYLORI SCREEN-BIOPSY: UREASE: NEGATIVE

## 2018-01-08 NOTE — Progress Notes (Signed)
[facility] no letter or recall but cc PCP results  Office  Let her know she does not have H pylori  Hopefully better on different PPI - if NOT improving let me know  She should see Jess in 4- 6 weeks

## 2018-01-22 ENCOUNTER — Other Ambulatory Visit: Payer: Self-pay

## 2018-01-24 NOTE — Telephone Encounter (Signed)
I called and left her a voice mail since I've not heard back from her.

## 2018-04-03 LAB — HM HEPATITIS C SCREENING LAB: HM Hepatitis Screen: NEGATIVE

## 2018-04-15 LAB — HM PAP SMEAR

## 2018-04-24 NOTE — L&D Delivery Note (Signed)
Delivery Note At 5:10 AM a viable female was delivered via Vaginal, Spontaneous (Presentation: LOA).  APGAR: 9, 9; weight pending.   Placenta status: S, I. 3V Cord with the following complications: none.  Cord pH: n/a  Anesthesia:  CLEA Episiotomy: None Lacerations: Periurethral Suture Repair: 3.0 vicryl rapide Est. Blood Loss (mL): 66  Mom to postpartum.  Baby to Couplet care / Skin to Skin.  Linda Hedges 11/02/2018, 5:41 AM

## 2018-07-24 LAB — HM HIV SCREENING LAB: HM HIV Screening: NEGATIVE

## 2018-07-24 LAB — HIV ANTIBODY (ROUTINE TESTING W REFLEX): HIV 1&2 Ab, 4th Generation: NONREACTIVE

## 2018-10-10 LAB — OB RESULTS CONSOLE GBS: GBS: NEGATIVE

## 2018-11-01 ENCOUNTER — Other Ambulatory Visit: Payer: Self-pay

## 2018-11-01 ENCOUNTER — Encounter (HOSPITAL_COMMUNITY): Payer: Self-pay | Admitting: *Deleted

## 2018-11-01 ENCOUNTER — Inpatient Hospital Stay (HOSPITAL_COMMUNITY)
Admission: AD | Admit: 2018-11-01 | Discharge: 2018-11-03 | DRG: 807 | Disposition: A | Discharge status: Home / Self Care | Payer: 59 | Attending: Obstetrics & Gynecology | Admitting: Obstetrics & Gynecology

## 2018-11-01 DIAGNOSIS — Z1159 Encounter for screening for other viral diseases: Secondary | ICD-10-CM | POA: Diagnosis not present

## 2018-11-01 DIAGNOSIS — Z87891 Personal history of nicotine dependence: Secondary | ICD-10-CM

## 2018-11-01 DIAGNOSIS — O26893 Other specified pregnancy related conditions, third trimester: Secondary | ICD-10-CM | POA: Diagnosis present

## 2018-11-01 DIAGNOSIS — Z3A39 39 weeks gestation of pregnancy: Secondary | ICD-10-CM | POA: Diagnosis not present

## 2018-11-01 DIAGNOSIS — Z3490 Encounter for supervision of normal pregnancy, unspecified, unspecified trimester: Secondary | ICD-10-CM

## 2018-11-01 LAB — TYPE AND SCREEN
ABO/RH(D): O POS
Antibody Screen: NEGATIVE

## 2018-11-01 LAB — SARS CORONAVIRUS 2 BY RT PCR (HOSPITAL ORDER, PERFORMED IN ~~LOC~~ HOSPITAL LAB): SARS Coronavirus 2: NEGATIVE

## 2018-11-01 LAB — CBC
HCT: 34 % — ABNORMAL LOW (ref 36.0–46.0)
Hemoglobin: 11.2 g/dL — ABNORMAL LOW (ref 12.0–15.0)
MCH: 30.4 pg (ref 26.0–34.0)
MCHC: 32.9 g/dL (ref 30.0–36.0)
MCV: 92.4 fL (ref 80.0–100.0)
Platelets: 229 10*3/uL (ref 150–400)
RBC: 3.68 MIL/uL — ABNORMAL LOW (ref 3.87–5.11)
RDW: 13.2 % (ref 11.5–15.5)
WBC: 8.4 10*3/uL (ref 4.0–10.5)
nRBC: 0 % (ref 0.0–0.2)

## 2018-11-01 LAB — ABO/RH: ABO/RH(D): O POS

## 2018-11-01 MED ORDER — EPHEDRINE 5 MG/ML INJ: 10.0000 mg | INTRAVENOUS | Status: DC | PRN

## 2018-11-01 MED ORDER — SOD CITRATE-CITRIC ACID 500-334 MG/5ML PO SOLN: 30.0000 mL | ORAL | Status: DC | PRN

## 2018-11-01 MED ORDER — FLEET ENEMA 7-19 GM/118ML RE ENEM: 1.0000 | ENEMA | RECTAL | Status: DC | PRN

## 2018-11-01 MED ORDER — ACETAMINOPHEN 325 MG PO TABS: 650.0000 mg | ORAL_TABLET | ORAL | Status: DC | PRN

## 2018-11-01 MED ORDER — OXYTOCIN 40 UNITS IN NORMAL SALINE INFUSION - SIMPLE MED
2.5000 [IU]/h | INTRAVENOUS | Status: DC
  Administered 2018-11-02: 2.5 [IU]/h via INTRAVENOUS
  Filled 2018-11-01: qty 1000

## 2018-11-01 MED ORDER — LACTATED RINGERS IV SOLN
INTRAVENOUS | Status: DC
  Administered 2018-11-02: via INTRAVENOUS

## 2018-11-01 MED ORDER — DIPHENHYDRAMINE HCL 50 MG/ML IJ SOLN: 12.5000 mg | INTRAMUSCULAR | Status: DC | PRN

## 2018-11-01 MED ORDER — FENTANYL-BUPIVACAINE-NACL 0.5-0.125-0.9 MG/250ML-% EP SOLN
12.0000 mL/h | EPIDURAL | Status: DC | PRN
  Filled 2018-11-01: qty 250

## 2018-11-01 MED ORDER — OXYTOCIN BOLUS FROM INFUSION
500.0000 mL | Freq: Once | INTRAVENOUS | Status: AC
  Administered 2018-11-02: 500 mL via INTRAVENOUS

## 2018-11-01 MED ORDER — OXYCODONE-ACETAMINOPHEN 5-325 MG PO TABS: 2.0000 | ORAL_TABLET | ORAL | Status: DC | PRN

## 2018-11-01 MED ORDER — ONDANSETRON HCL 4 MG/2ML IJ SOLN: 4.0000 mg | Freq: Four times a day (QID) | INTRAMUSCULAR | Status: DC | PRN

## 2018-11-01 MED ORDER — LACTATED RINGERS IV SOLN: 500.0000 mL | INTRAVENOUS | Status: DC | PRN

## 2018-11-01 MED ORDER — PHENYLEPHRINE 40 MCG/ML (10ML) SYRINGE FOR IV PUSH (FOR BLOOD PRESSURE SUPPORT): 80.0000 ug | PREFILLED_SYRINGE | INTRAVENOUS | Status: DC | PRN

## 2018-11-01 MED ORDER — OXYCODONE-ACETAMINOPHEN 5-325 MG PO TABS: 1.0000 | ORAL_TABLET | ORAL | Status: DC | PRN

## 2018-11-01 MED ORDER — LACTATED RINGERS IV SOLN
500.0000 mL | Freq: Once | INTRAVENOUS | Status: AC
  Administered 2018-11-02: 500 mL via INTRAVENOUS

## 2018-11-01 MED ORDER — LIDOCAINE HCL (PF) 1 % IJ SOLN: 30.0000 mL | INTRAMUSCULAR | Status: DC | PRN

## 2018-11-01 NOTE — MAU Note (Signed)
Pt reports to MAU c/o frequent ctxs. Pt denise bleeding or LOF. +FM.

## 2018-11-01 NOTE — H&P (Signed)
Alicia Elliott is a 28 y.o. female G2P1011 at 39 weeks presenting for SOL. CTX started around 6pm and have increased in intensity.  NO LOF or VB.  Active FM.  Antepartum course uncomplicated. H/o alcohol and narcotic abuse years ago; no use this pregnancy.  GBS negative.    OB History    Gravida  3   Para  1   Term  1   Preterm      AB  1   Living  1     SAB  1   TAB      Ectopic      Multiple  0   Live Births  1          Past Medical History:  Diagnosis Date  . Alcoholism /alcohol abuse ([place])    sober since 2015  . Allergy   . GERD (gastroesophageal reflux disease)   . Substance abuse [facility])    Past Surgical History:  Procedure Laterality Date  . CYST EXCISION    . HERNIA REPAIR    . TONSILLECTOMY    . WISDOM TOOTH EXTRACTION     Family History: family history includes Diabetes in her father; Diverticulitis in her father. Social History:  reports that she has quit smoking. She has never used smokeless tobacco. She reports previous alcohol use. She reports that she does not use drugs.     Maternal Diabetes: No Genetic Screening: Normal Maternal Ultrasounds/Referrals: Normal Fetal Ultrasounds or other Referrals:  None Maternal Substance Abuse:  No Significant Maternal Medications:  None Significant Maternal Lab Results:  Group B Strep negative Other Comments:  None  ROS Maternal Medical History:  Reason for admission: Contractions.   Contractions: Onset was 3-5 hours ago.   Frequency: regular.   Perceived severity is moderate.    Fetal activity: Perceived fetal activity is normal.   Last perceived fetal movement was within the past hour.    Prenatal complications: no prenatal complications Prenatal Complications - Diabetes: none.    Dilation: 5 Effacement (%): 90 Station: -2 Exam by:: Volney American RN  Last menstrual period 11/27/2017, not currently breastfeeding. Maternal Exam:  Uterine Assessment: Contraction strength is moderate.   Contraction frequency is regular.   Abdomen: Patient reports no abdominal tenderness. Fundal height is c/w dates.   Estimated fetal weight is 7#4.       Fetal Exam Fetal Monitor Review: Baseline rate: 135.  Variability: moderate (6-25 bpm).   Pattern: accelerations present and no decelerations.    Fetal State Assessment: Category I - tracings are normal.     Physical Exam  Constitutional: She is oriented to person, place, and time. She appears well-developed and well-nourished.  GI: Soft. There is no rebound and no guarding.  Neurological: She is alert and oriented to person, place, and time.  Skin: Skin is warm and dry.  Psychiatric: She has a normal mood and affect. Her behavior is normal.    Prenatal labs: ABO, Rh: --/--/PENDING (07/10 2146)O pos Antibody: PENDING (07/10 2146)negative Rubella:  Immune RPR:   NR HBsAg:   NR HIV:   NR GBS:   Negative  Assessment/Plan: 28yo G3P1011 at 39 weeks with SOL -CLEA -Anticipate NSVD   Linda Hedges 11/01/2018, 10:17 PM

## 2018-11-01 NOTE — MAU Note (Signed)
Covid swab obtained without difficulty. Pt tol well. No symptoms 

## 2018-11-02 ENCOUNTER — Encounter (HOSPITAL_COMMUNITY): Payer: Self-pay

## 2018-11-02 ENCOUNTER — Inpatient Hospital Stay (HOSPITAL_COMMUNITY): Payer: 59 | Admitting: Anesthesiology

## 2018-11-02 DIAGNOSIS — Z3490 Encounter for supervision of normal pregnancy, unspecified, unspecified trimester: Secondary | ICD-10-CM

## 2018-11-02 LAB — RPR: RPR Ser Ql: NONREACTIVE

## 2018-11-02 MED ORDER — IBUPROFEN 600 MG PO TABS
600.0000 mg | ORAL_TABLET | Freq: Four times a day (QID) | ORAL | Status: DC
  Administered 2018-11-02 – 2018-11-03 (×4): 600 mg via ORAL
  Filled 2018-11-02 (×4): qty 1

## 2018-11-02 MED ORDER — ONDANSETRON HCL 4 MG/2ML IJ SOLN: 4.0000 mg | INTRAMUSCULAR | Status: DC | PRN

## 2018-11-02 MED ORDER — SENNOSIDES-DOCUSATE SODIUM 8.6-50 MG PO TABS
2.0000 | ORAL_TABLET | ORAL | Status: DC
  Administered 2018-11-02: 2 via ORAL
  Filled 2018-11-02: qty 2

## 2018-11-02 MED ORDER — BENZOCAINE-MENTHOL 20-0.5 % EX AERO
1.0000 "application " | INHALATION_SPRAY | CUTANEOUS | Status: DC | PRN
  Administered 2018-11-02: 1 via TOPICAL
  Filled 2018-11-02: qty 56

## 2018-11-02 MED ORDER — ZOLPIDEM TARTRATE 5 MG PO TABS: 5.0000 mg | ORAL_TABLET | Freq: Every evening | ORAL | Status: DC | PRN

## 2018-11-02 MED ORDER — COCONUT OIL OIL: 1.0000 "application " | TOPICAL_OIL | Status: DC | PRN

## 2018-11-02 MED ORDER — SIMETHICONE 80 MG PO CHEW: 80.0000 mg | CHEWABLE_TABLET | ORAL | Status: DC | PRN

## 2018-11-02 MED ORDER — ACETAMINOPHEN 325 MG PO TABS: 650.0000 mg | ORAL_TABLET | ORAL | Status: DC | PRN

## 2018-11-02 MED ORDER — TETANUS-DIPHTH-ACELL PERTUSSIS 5-2.5-18.5 LF-MCG/0.5 IM SUSP: 0.5000 mL | Freq: Once | INTRAMUSCULAR | Status: DC

## 2018-11-02 MED ORDER — ONDANSETRON HCL 4 MG PO TABS: 4.0000 mg | ORAL_TABLET | ORAL | Status: DC | PRN

## 2018-11-02 MED ORDER — DIBUCAINE (PERIANAL) 1 % EX OINT: 1.0000 "application " | TOPICAL_OINTMENT | CUTANEOUS | Status: DC | PRN

## 2018-11-02 MED ORDER — SODIUM CHLORIDE (PF) 0.9 % IJ SOLN
INTRAMUSCULAR | Status: DC | PRN
  Administered 2018-11-02: 12 mL/h via EPIDURAL

## 2018-11-02 MED ORDER — PRENATAL MULTIVITAMIN CH
1.0000 | ORAL_TABLET | Freq: Every day | ORAL | Status: DC
  Administered 2018-11-02: 1 via ORAL
  Filled 2018-11-02: qty 1

## 2018-11-02 MED ORDER — LACTATED RINGERS IV SOLN: 500.0000 mL | Freq: Once | INTRAVENOUS | Status: DC

## 2018-11-02 MED ORDER — DIPHENHYDRAMINE HCL 25 MG PO CAPS: 25.0000 mg | ORAL_CAPSULE | Freq: Four times a day (QID) | ORAL | Status: DC | PRN

## 2018-11-02 MED ORDER — WITCH HAZEL-GLYCERIN EX PADS: 1.0000 "application " | MEDICATED_PAD | CUTANEOUS | Status: DC | PRN

## 2018-11-02 MED ORDER — LIDOCAINE HCL (PF) 1 % IJ SOLN
INTRAMUSCULAR | Status: DC | PRN
  Administered 2018-11-02 (×2): 4 mL via EPIDURAL

## 2018-11-02 NOTE — Anesthesia Preprocedure Evaluation (Addendum)
Anesthesia Evaluation  Patient identified by MRN, date of birth, ID band Patient awake    Reviewed: Allergy & Precautions, Patient's Chart, lab work & pertinent test results  History of Anesthesia Complications Negative for: history of anesthetic complications  Airway Mallampati: II  TM Distance: >3 FB Neck ROM: Full    Dental no notable dental hx.    Pulmonary former smoker,    Pulmonary exam normal        Cardiovascular negative cardio ROS Normal cardiovascular exam     Neuro/Psych negative neurological ROS     GI/Hepatic GERD  Medicated and Controlled,Remote hx of EtOH and opioid abuse (quit 2015)   Endo/Other  negative endocrine ROS  Renal/GU negative Renal ROS     Musculoskeletal negative musculoskeletal ROS (+)   Abdominal   Peds  Hematology negative hematology ROS (+)   Anesthesia Other Findings Day of surgery medications reviewed with the patient.  Reproductive/Obstetrics (+) Pregnancy                            Anesthesia Physical Anesthesia Plan  ASA: II  Anesthesia Plan: Epidural   Post-op Pain Management:    Induction:   PONV Risk Score and Plan: Treatment may vary due to age or medical condition  Airway Management Planned: Natural Airway  Additional Equipment:   Intra-op Plan:   Post-operative Plan:   Informed Consent: I have reviewed the patients History and Physical, chart, labs and discussed the procedure including the risks, benefits and alternatives for the proposed anesthesia with the patient or authorized representative who has indicated his/her understanding and acceptance.       Plan Discussed with:   Anesthesia Plan Comments:        Anesthesia Quick Evaluation

## 2018-11-02 NOTE — Anesthesia Procedure Notes (Signed)
Epidural Patient location during procedure: OB Start time: 11/02/2018 12:29 AM End time: 11/02/2018 12:32 AM  Staffing Anesthesiologist: Brennan Bailey, MD Performed: anesthesiologist   Preanesthetic Checklist Completed: patient identified, pre-op evaluation, timeout performed, IV checked, risks and benefits discussed and monitors and equipment checked  Epidural Patient position: sitting Prep: site prepped and draped and DuraPrep Patient monitoring: continuous pulse ox, blood pressure, heart rate and cardiac monitor Approach: midline Location: L3-L4 Injection technique: LOR air  Needle:  Needle type: Tuohy  Needle gauge: 17 G Needle length: 9 cm Catheter type: closed end flexible Catheter size: 19 Gauge Catheter at skin depth: 9 cm Test dose: negative and Other (1% lidocaine)  Assessment Events: blood not aspirated, injection not painful, no injection resistance, negative IV test and no paresthesia  Additional Notes Patient identified. Risks, benefits, and alternatives discussed with patient including but not limited to bleeding, infection, nerve damage, paralysis, failed block, incomplete pain control, headache, blood pressure changes, nausea, vomiting, reactions to medication, itching, and postpartum back pain. Confirmed with bedside nurse the patient's most recent platelet count. Confirmed with patient that they are not currently taking any anticoagulation, have any bleeding history, or any family history of bleeding disorders. Patient expressed understanding and wished to proceed. All questions were answered. Sterile technique was used throughout the entire procedure. Crisp LOR on first pass. Please see nursing notes for vital signs. Test dose was given through epidural catheter and negative prior to continuing to dose epidural or start infusion. Warning signs of high block given to the patient including shortness of breath, tingling/numbness in hands, complete motor block, or  any concerning symptoms with instructions to call for help. Patient was given instructions on fall risk and not to get out of bed. All questions and concerns addressed with instructions to call with any issues or inadequate analgesia.  Reason for block:procedure for pain

## 2018-11-02 NOTE — Anesthesia Postprocedure Evaluation (Signed)
Anesthesia Post Note  Patient: Alicia Elliott  Procedure(s) Performed: AN AD HOC LABOR EPIDURAL     Patient location during evaluation: Mother Baby Anesthesia Type: Epidural Level of consciousness: awake Pain management: satisfactory to patient Vital Signs Assessment: post-procedure vital signs reviewed and stable Respiratory status: spontaneous breathing Cardiovascular status: stable Anesthetic complications: no    Last Vitals:  Vitals:   11/02/18 0845 11/02/18 1250  BP: 114/81 112/67  Pulse: 96 91  Resp: 18 16  Temp: 36.6 C 37.2 C  SpO2: 99% 97%    Last Pain:  Vitals:   11/02/18 1250  TempSrc: Oral  PainSc: 0-No pain   Pain Goal:                Epidural/Spinal Function Cutaneous sensation: Normal sensation (11/02/18 1250), Patient able to flex knees: Yes (11/02/18 1250), Patient able to lift hips off bed: Yes (11/02/18 1250), Back pain beyond tenderness at insertion site: No (11/02/18 1250), Progressively worsening motor and/or sensory loss: No (11/02/18 1250)  Casimer Lanius

## 2018-11-02 NOTE — Plan of Care (Signed)
Free of injury

## 2018-11-03 LAB — CBC
HCT: 33.9 % — ABNORMAL LOW (ref 36.0–46.0)
Hemoglobin: 10.9 g/dL — ABNORMAL LOW (ref 12.0–15.0)
MCH: 30.4 pg (ref 26.0–34.0)
MCHC: 32.2 g/dL (ref 30.0–36.0)
MCV: 94.4 fL (ref 80.0–100.0)
Platelets: 226 10*3/uL (ref 150–400)
RBC: 3.59 MIL/uL — ABNORMAL LOW (ref 3.87–5.11)
RDW: 13.4 % (ref 11.5–15.5)
WBC: 9.8 10*3/uL (ref 4.0–10.5)
nRBC: 0 % (ref 0.0–0.2)

## 2018-11-03 MED ORDER — IBUPROFEN 600 MG PO TABS: 600.0000 mg | ORAL_TABLET | Freq: Four times a day (QID) | ORAL | 0 refills | Status: DC

## 2018-11-03 NOTE — Progress Notes (Signed)
Post Partum Day 1 Subjective: no complaints, up ad lib, voiding and tolerating PO  Objective: Blood pressure 121/88, pulse 74, temperature 97.7 F (36.5 C), temperature source Oral, resp. rate 20, height 5\' 7"  (1.702 m), weight 78.9 kg, last menstrual period 11/27/2017, SpO2 97 %, unknown if currently breastfeeding.  Physical Exam:  General: alert, cooperative and appears stated age Lochia: appropriate Uterine Fundus: firm Incision: healing well, no significant drainage, no dehiscence DVT Evaluation: No evidence of DVT seen on physical exam. Negative Homan's sign. No cords or calf tenderness.  Recent Labs    11/01/18 2146 11/03/18 0440  HGB 11.2* 10.9*  HCT 34.0* 33.9*    Assessment/Plan: Discharge home   LOS: 2 days   Linda Hedges 11/03/2018, 6:59 AM

## 2018-11-03 NOTE — Discharge Instructions (Signed)
Call MD for T>100.4, heavy vaginal bleeding, severe abdominal pain, or respiratory distress.  Call office to schedule postpartum visit in 6 weeks.  Pelvic rest x 6 weeks.   

## 2018-11-03 NOTE — Discharge Summary (Signed)
Obstetric Discharge Summary Reason for Admission: onset of labor Prenatal Procedures: none Intrapartum Procedures: spontaneous vaginal delivery Postpartum Procedures: none Complications-Operative and Postpartum: bilateral periurethral laceration Hemoglobin  Date Value Ref Range Status  11/03/2018 10.9 (L) 12.0 - 15.0 g/dL Final   HCT  Date Value Ref Range Status  11/03/2018 33.9 (L) 36.0 - 46.0 % Final    Physical Exam:  General: alert, cooperative and appears stated age 7: appropriate Uterine Fundus: firm Incision: healing well, no significant drainage, no dehiscence DVT Evaluation: No evidence of DVT seen on physical exam. Negative Homan's sign. No cords or calf tenderness.  Discharge Diagnoses: Term Pregnancy-delivered  Discharge Information: Date: 11/03/2018 Activity: pelvic rest Diet: routine Medications: PNV and Ibuprofen Condition: stable Instructions: refer to practice specific booklet Discharge to: home   Newborn Data: Live born female  Birth Weight: 8 lb 0.4 oz (3640 g) APGAR: 9, 9  Newborn Delivery   Birth date/time: 11/02/2018 05:10:00 Delivery type: Vaginal, Spontaneous      Home with mother.  Linda Hedges 11/03/2018, 7:01 AM

## 2018-11-07 ENCOUNTER — Encounter (HOSPITAL_COMMUNITY): Payer: 59

## 2018-12-17 DIAGNOSIS — K219 Gastro-esophageal reflux disease without esophagitis: Secondary | ICD-10-CM | POA: Insufficient documentation

## 2019-08-21 ENCOUNTER — Telehealth: Payer: Self-pay | Admitting: Obstetrics and Gynecology

## 2019-08-21 NOTE — Telephone Encounter (Signed)
Left a voicemail for patient letting her know we received her e-mail about wanting to become new patient, and to give [place] a call back.

## 2019-12-16 ENCOUNTER — Other Ambulatory Visit: Payer: Self-pay

## 2019-12-16 ENCOUNTER — Ambulatory Visit (INDEPENDENT_AMBULATORY_CARE_PROVIDER_SITE_OTHER): Payer: 59 | Admitting: Physician Assistant

## 2019-12-16 ENCOUNTER — Encounter: Payer: Self-pay | Admitting: Physician Assistant

## 2019-12-16 VITALS — BP 124/78 | HR 75 | Temp 98.1°F | Ht 67.0 in | Wt 137.5 lb

## 2019-12-16 DIAGNOSIS — M545 Low back pain, unspecified: Secondary | ICD-10-CM

## 2019-12-16 NOTE — Progress Notes (Signed)
Alicia Elliott is a 29 y.o. female is here to establish care and discuss back pain.  I acted as a Neurosurgeon for [employer], PA-C Corky Mull, LPN   History of Present Illness:   Chief Complaint  Patient presents with  . Establish Care  . Back Pain    HPI   Back pain Ever since her second child 1 year ago, has morning stiffness and pain throughout the day. Symptoms worsening with time. Tightness sensation on the L-side and "pinching" sensation on the R side. Had epidurals for both babies. Doesn't take medication for her symptoms as they are not severe.  Denies: numbness/tingling to LE, unusual cancer in family, weight loss, urinary incontinence (she is having work-up later this week for possible pelvic prolapse -- states that she feels two different bulges in her vagina when she does valsalva movements.)  Baseline activity: walks a lot and uses steps as much as possible. 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 Quit strength training due to her back pain. Does [employer] -- does some [employer] work-outs.   Health Maintenance Due  Topic Date Due  . Hepatitis C Screening  Never done  . INFLUENZA VACCINE  11/23/2019    Past Medical History:  Diagnosis Date  . Alcoholism /alcohol abuse (HCC)    stopped in 2015  . Allergy   . Substance abuse (HCC)    experimented with drugs during college  . Vaginal delivery 04/24/2017, 11/02/2018     Social History   Tobacco Use  . Smoking status: Former Smoker    Types: Cigarettes  . Smokeless tobacco: Never Used  . Tobacco comment: Quit in 2014  Vaping Use  . Vaping Use: Never used  Substance Use Topics  . Alcohol use: Yes    Comment: Glass of wine 2x's month  . Drug use: Not Currently    Comment: Hx in college    Past Surgical History:  Procedure Laterality Date  . CYST EXCISION     L side of breast  . HERNIA REPAIR Right 2006   right inguinal hernia repair  . TONSILLECTOMY    . WISDOM TOOTH EXTRACTION      Family History    Problem Relation Age of Onset  . Diabetes Father   . Diverticulitis Father   . Colon cancer Neg Hx   . Stomach cancer Neg Hx   . Esophageal cancer Neg Hx   . Rectal cancer Neg Hx     PMHx, SurgHx, SocialHx, FamHx, Medications, and Allergies were reviewed in the Visit Navigator and updated as appropriate.   Patient Active Problem List   Diagnosis Date Noted  . Gastroesophageal reflux disease 12/17/2018  . Pregnancy 11/02/2018  . Indication for care in labor or delivery 11/01/2018  . Abdominal pain, epigastric 10/03/2017  . Nausea without vomiting 10/03/2017    Social History   Tobacco Use  . Smoking status: Former Smoker    Types: Cigarettes  . Smokeless tobacco: Never Used  . Tobacco comment: Quit in 2014  Vaping Use  . Vaping Use: Never used  Substance Use Topics  . Alcohol use: Yes    Comment: Glass of wine 2x's month  . Drug use: Not Currently    Comment: Hx in college    Current Medications and Allergies:    Current Outpatient Medications:  .  acetaminophen (TYLENOL) 325 MG tablet, Take 2 tablets (650 mg total) by mouth every 6 (six) hours as needed (for pain scale < 4)., Disp: 60 tablet, Rfl:  0 .  etonogestrel (NEXPLANON) 68 MG IMPL implant, Nexplanon 68 mg subdermal implant  Inject 1 implant by subcutaneous route as directed., Disp: , Rfl:  .  ibuprofen (ADVIL) 600 MG tablet, Take 1 tablet (600 mg total) by mouth every 6 (six) hours., Disp: 30 tablet, Rfl: 0  No Known Allergies  Review of Systems   ROS  Negative unless otherwise specified per HPI.  Vitals:   Vitals:   12/16/19 0818  BP: 124/78  Pulse: 75  Temp: 98.1 F (36.7 C)  TempSrc: Temporal  SpO2: 97%  Weight: 137 lb 8 oz (62.4 kg)  Height: 5\' 7"  (1.702 m)     Body mass index is 21.54 kg/m.   Physical Exam:    Physical Exam Constitutional:      Appearance: She is well-developed.  HENT:     Head: Normocephalic and atraumatic.  Eyes:     Conjunctiva/sclera: Conjunctivae normal.   Pulmonary:     Effort: Pulmonary effort is normal.  Musculoskeletal:        General: Normal range of motion.     Cervical back: Normal range of motion and neck supple.     Comments: No decreased ROM 2/2 pain with flexion/extension, lateral side bends, or rotation. Reproducible tenderness with deep palpation to bilateral lumbar paraspinal muscles. No bony tenderness.  Skin:    General: Skin is warm and dry.  Neurological:     Mental Status: She is alert and oriented to person, place, and time.  Psychiatric:        Behavior: Behavior normal.        Thought Content: Thought content normal.        Judgment: Judgment normal.       Assessment and Plan:    [name] was seen today for establish care and back pain.  Diagnoses and all orders for this visit:  Lumbar pain -     DG Lumbar Spine Complete; Future   No red flags on exam. Will obtain lumbar xray for patient at [facility] office. If xray is normal, will refer to PT. If xray is abnormal, will refer to ortho vs sports medicine.  . Reviewed expectations re: course of current medical issues. . Discussed self-management of symptoms. . Outlined signs and symptoms indicating need for more acute intervention. . Patient verbalized understanding and all questions were answered. . See orders for this visit as documented in the electronic medical record. . Patient received an After Visit Summary.  CMA or LPN served as scribe during this visit. History, Physical, and Plan performed by medical provider. The above documentation has been reviewed and is accurate and complete.   [facility], PA-C [place], [place] 12/16/2019  Follow-up: No follow-ups on file.

## 2019-12-16 NOTE — Patient Instructions (Signed)
It was great to see you!  Back xray An order for an xray has been put in for you. To get your xray, you can walk in at the [facility] location without a scheduled appointment. The address is [address] It is across the street from [facility]. X-ray is located in the basement.  Hours of operation are M-F 8:30am to 5:00pm. Please note that they are closed for lunch between 12:30 and 1:00pm.  Depending on results, we will send you to physical therapy vs specialist. I will be in touch with the result!  Take care,  Jarold Motto PA-C

## 2019-12-22 IMAGING — US US ABDOMEN LIMITED
1 series · 14 of 25 positions shown · non-contrast
Comparison: None.

CLINICAL DATA: Epigastric pain and nausea for 1 month

EXAM:
ULTRASOUND ABDOMEN LIMITED RIGHT UPPER QUADRANT

[Series 1: us abdomen limited · 0.22mm/px · 14 of 64 slices shown]
[im 1/64]
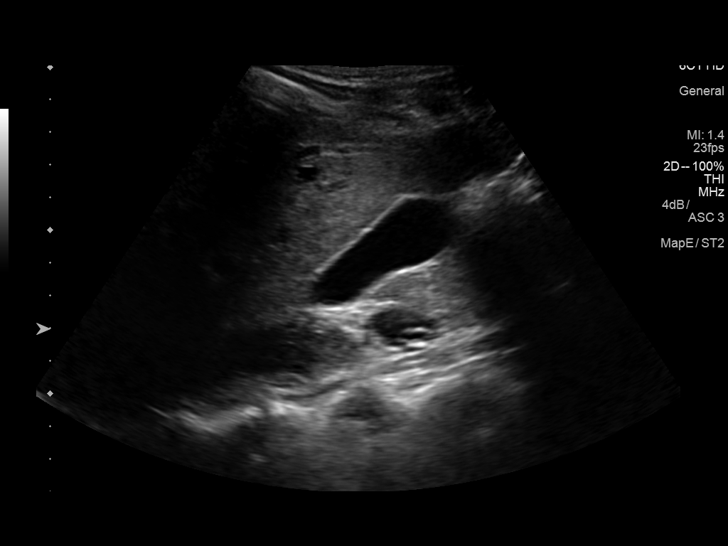
[im 6/64]
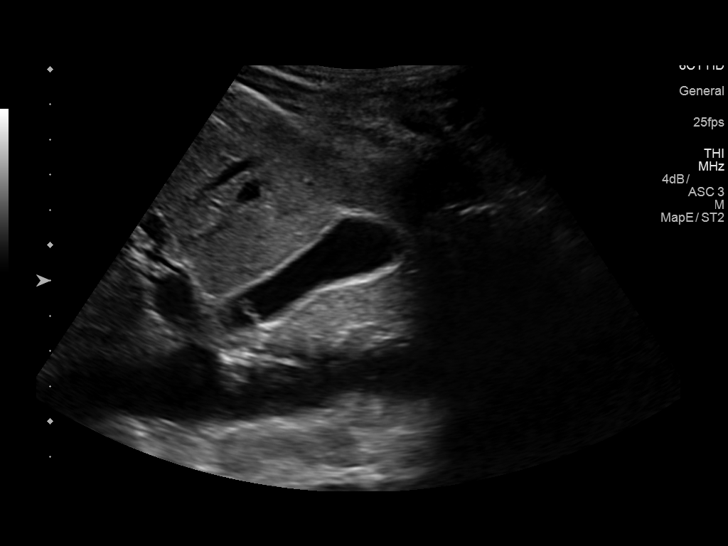
[im 11/64]
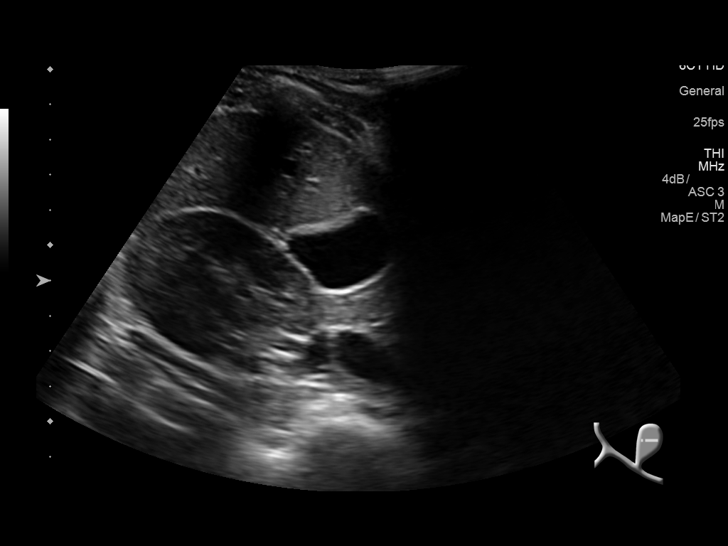
[im 16/64]
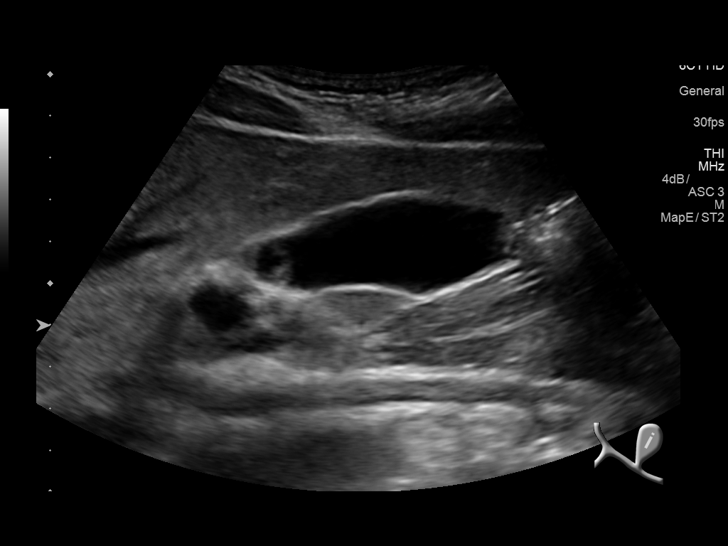
[im 22/64]
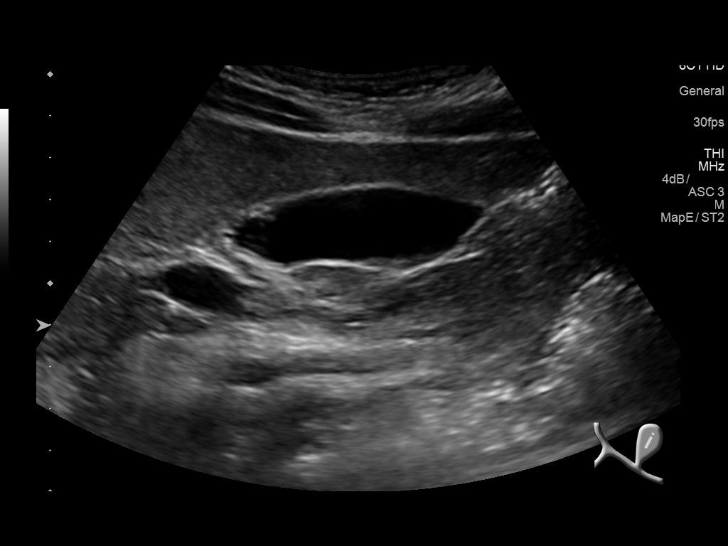
[im 24/64]
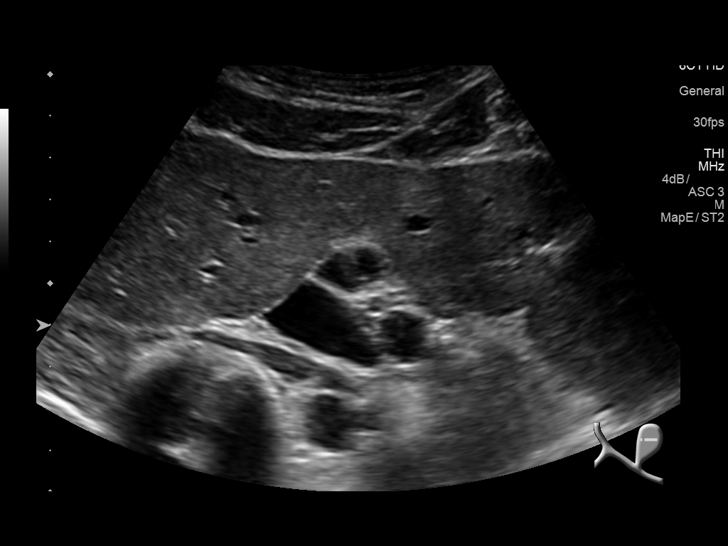
[im 29/64]
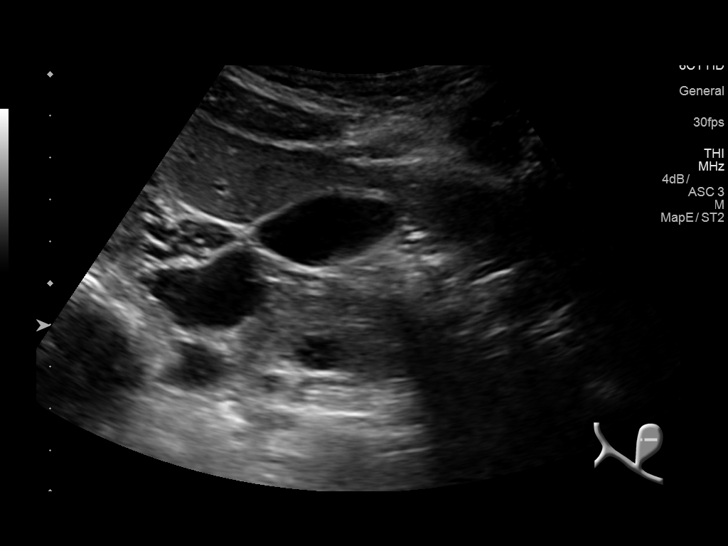
[im 35/64]
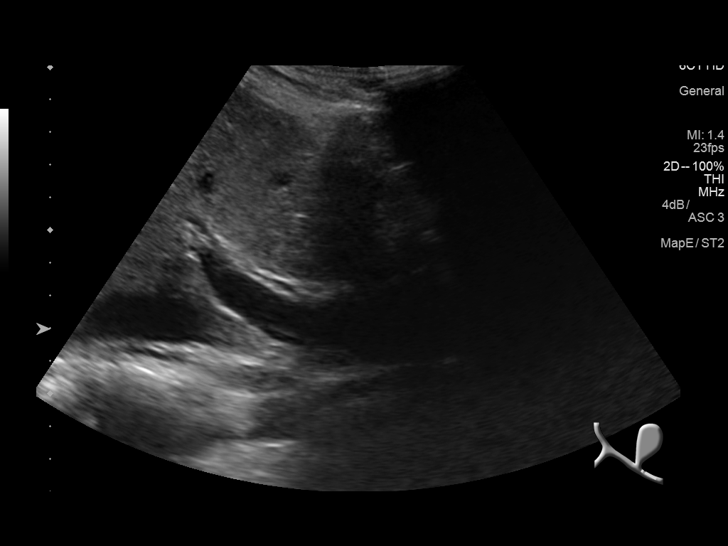
[im 40/64]
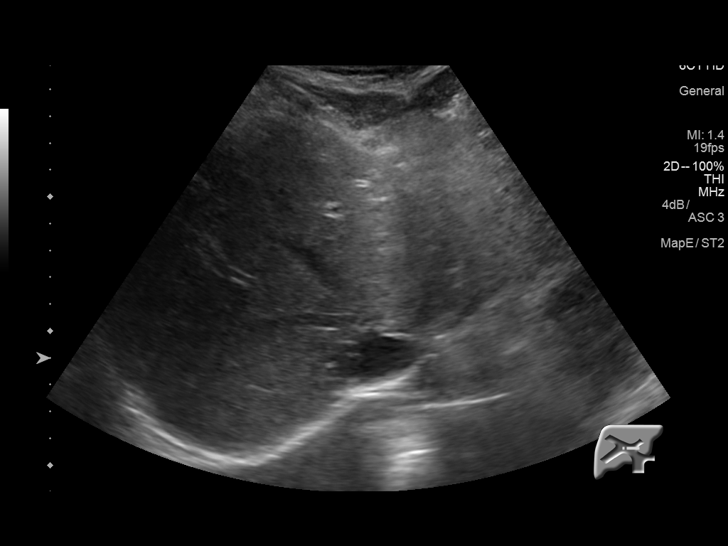
[im 43/64]
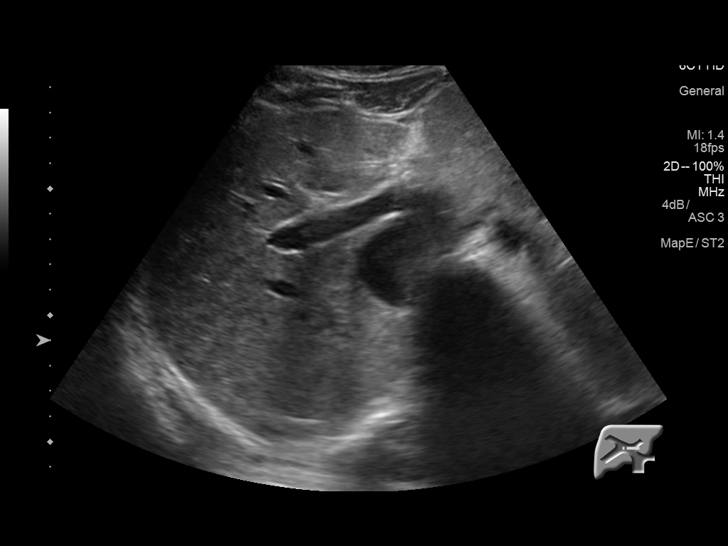
[im 48/64]
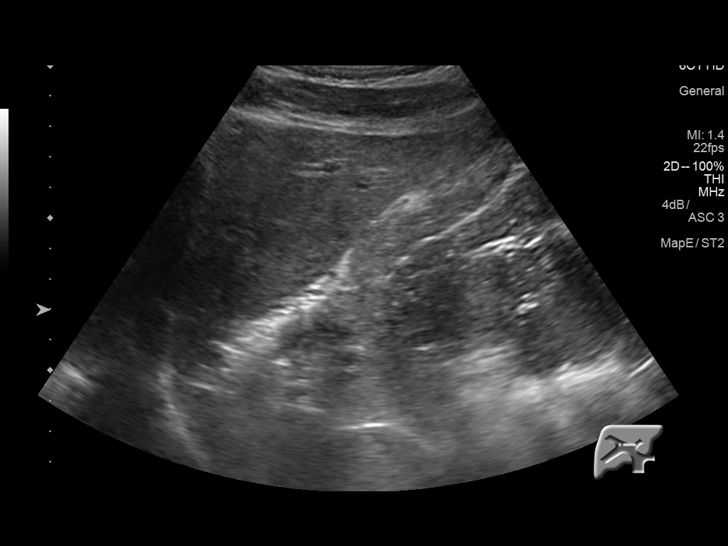
[im 53/64]
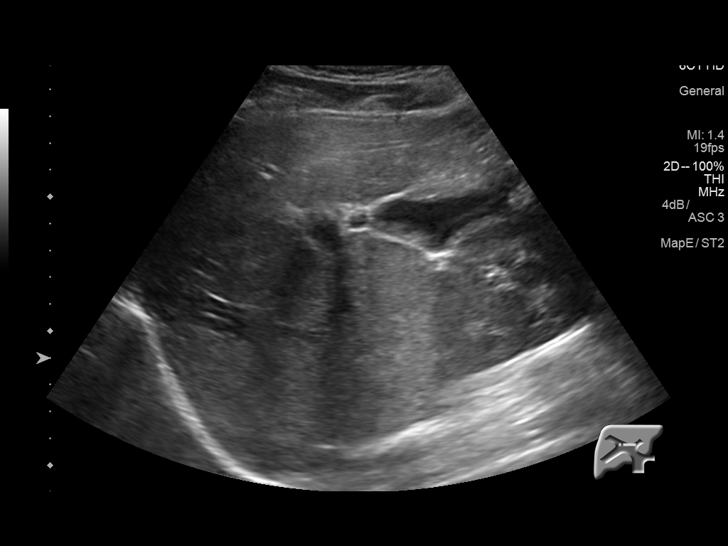
[im 58/64]
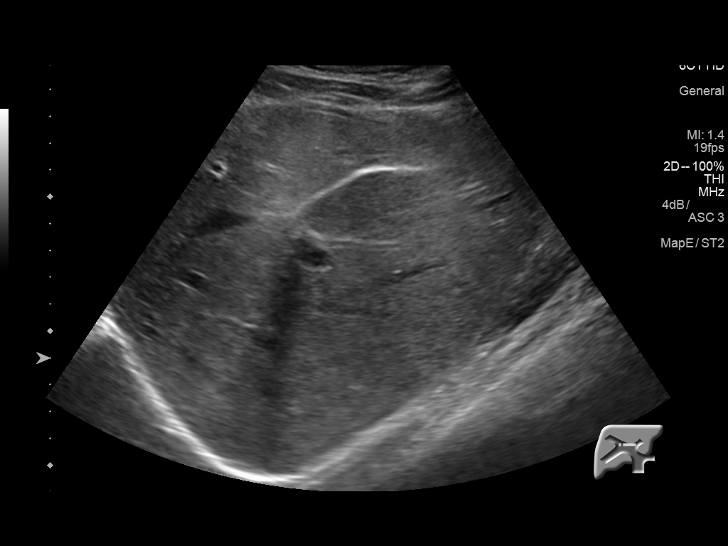
[im 64/64]
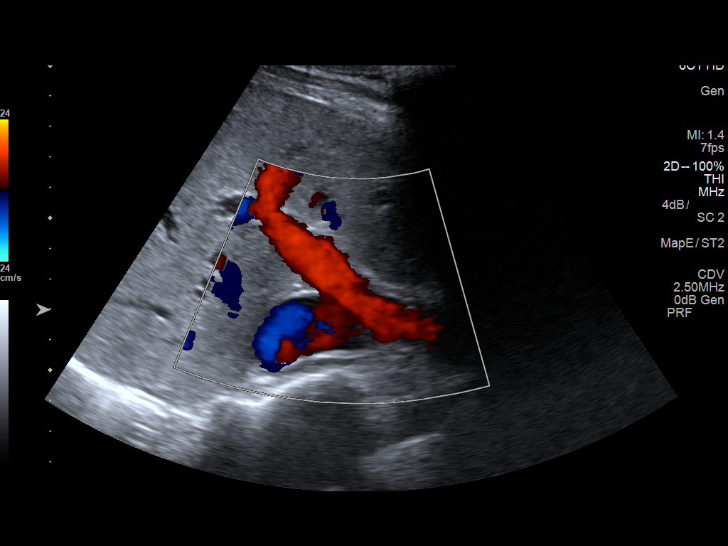

[14 of 25 positions shown; findings below may reference images not displayed]

FINDINGS: Gallbladder:

Gallbladder is well distended.  No cholelithiasis is identified.

Common bile duct:

Diameter: 2.4 mm.

Liver:

No focal lesion identified. Within normal limits in parenchymal
echogenicity. Portal vein is patent on color Doppler imaging with
normal direction of blood flow towards the liver.
IMPRESSION: No acute abnormality noted.

## 2020-01-17 ENCOUNTER — Encounter: Payer: Self-pay | Admitting: Physician Assistant

## 2020-02-16 ENCOUNTER — Encounter: Payer: Self-pay | Admitting: Physician Assistant

## 2020-02-16 ENCOUNTER — Other Ambulatory Visit: Payer: Self-pay

## 2020-02-16 ENCOUNTER — Telehealth (INDEPENDENT_AMBULATORY_CARE_PROVIDER_SITE_OTHER): Payer: 59 | Admitting: Physician Assistant

## 2020-02-16 ENCOUNTER — Other Ambulatory Visit: Payer: 59

## 2020-02-16 VITALS — Ht 67.0 in | Wt 140.0 lb

## 2020-02-16 DIAGNOSIS — R059 Cough, unspecified: Secondary | ICD-10-CM | POA: Diagnosis not present

## 2020-02-16 MED ORDER — AZITHROMYCIN 250 MG PO TABS: ORAL_TABLET | ORAL | 0 refills | Status: DC

## 2020-02-16 NOTE — Progress Notes (Signed)
Virtual Visit via Video   I connected with Alicia Elliott on 02/16/20 at 10:00 AM EDT by a video enabled telemedicine application and verified that I am speaking with the correct person using two identifiers. Location patient: Home Location provider: [facility], Office Persons participating in the virtual visit: Kalan Rinn, Jarold Motto PA-C, Corky Mull, LPN   I discussed the limitations of evaluation and management by telemedicine and the availability of in person appointments. The patient expressed understanding and agreed to proceed.  I acted as a Neurosurgeon for [employer], PA-C [employer], LPN   Subjective:   HPI:   Patient is requesting evaluation for possible COVID-19.  Symptom onset: started last Wednesday 10/20, Pt did a COVID test on Friday and it was Negative.  Travel/contacts: No exposure or contacts per pt  Vaccination status: completed  Patient endorses the following symptoms: sinus headache, sinus congestion, rhinorrhea, sore throat, productive cough (expectorating yellow sputum) and myalgia , Pt says she is having coughing spells.  Patient denies the following symptoms: Fever (none), ear pain, difficulty swallowing, wheezing, shortness of breath, chest tightness and chest pain  Treatments tried: Ibuprofen, Alka-seltzer, Theraflu   Patient risk factors: Current COVID-19 risk of complications score: 0 Smoking status: Alicia Elliott  reports that she has quit smoking. Her smoking use included cigarettes. She has never used smokeless tobacco. If female, currently pregnant? []   Yes [x]   No  ROS: See pertinent positives and negatives per HPI.  Patient Active Problem List   Diagnosis Date Noted  . Gastroesophageal reflux disease 12/17/2018  . Pregnancy 11/02/2018  . Indication for care in labor or delivery 11/01/2018  . Abdominal pain, epigastric 10/03/2017  . Nausea without vomiting 10/03/2017    Social History   Tobacco Use  .  Smoking status: Former Smoker    Types: Cigarettes  . Smokeless tobacco: Never Used  . Tobacco comment: Quit in 2014  Substance Use Topics  . Alcohol use: Yes    Comment: Glass of wine 2x's month    Current Outpatient Medications:  .  acetaminophen (TYLENOL) 325 MG tablet, Take 2 tablets (650 mg total) by mouth every 6 (six) hours as needed (for pain scale < 4)., Disp: 60 tablet, Rfl: 0 .  etonogestrel (NEXPLANON) 68 MG IMPL implant, Nexplanon 68 mg subdermal implant  Inject 1 implant by subcutaneous route as directed., Disp: , Rfl:  .  ibuprofen (ADVIL) 600 MG tablet, Take 1 tablet (600 mg total) by mouth every 6 (six) hours., Disp: 30 tablet, Rfl: 0 .  azithromycin (ZITHROMAX) 250 MG tablet, Take two tablets on day 1, then one daily x 4 days, Disp: 6 tablet, Rfl: 0  No Known Allergies  Objective:   VITALS: Per patient if applicable, see vitals. GENERAL: Alert, appears well and in no acute distress. HEENT: Atraumatic, conjunctiva clear, no obvious abnormalities on inspection of external nose and ears. NECK: Normal movements of the head and neck. CARDIOPULMONARY: No increased WOB. Speaking in clear sentences. I:E ratio WNL.  MS: Moves all visible extremities without noticeable abnormality. PSYCH: Pleasant and cooperative, well-groomed. Speech normal rate and rhythm. Affect is appropriate. Insight and judgement are appropriate. Attention is focused, linear, and appropriate.  NEURO: CN grossly intact. Oriented as arrived to appointment on time with no prompting. Moves both UE equally.  SKIN: No obvious lesions, wounds, erythema, or cyanosis noted on face or hands.  Assessment and Plan:   Staria was seen today for cough.  Diagnoses and all orders for this  visit:  Cough  Other orders -     azithromycin (ZITHROMAX) 250 MG tablet; Take two tablets on day 1, then one daily x 4 days    No red flags on discussion, patient is not in any obvious distress during our visit. Discussed  progression of most viral illness, and recommended supportive care at this point in time. Recommend oral delsym (or generic equivalent.)  I did however provide pocket rx for oral azithromycin should symptoms not improve as anticipated.  Discussed over the counter supportive care options, with recommendations to push fluids and rest. Reviewed return precautions including new/worsening fever, SOB, new/worsening cough or other concerns.  Recommended need to self-quarantine and practice social distancing until symptoms resolve. Discussed current recommendations for COVID testing -- she is coming to our office this afternoon for testing.  I recommend that patient follow-up if symptoms worsen or persist despite treatment x 7-10 days, sooner if needed.  I discussed the assessment and treatment plan with the patient. The patient was provided an opportunity to ask questions and all were answered. The patient agreed with the plan and demonstrated an understanding of the instructions.   The patient was advised to call back or seek an in-person evaluation if the symptoms worsen or if the condition fails to improve as anticipated.   CMA or LPN served as scribe during this visit. History, Physical, and Plan performed by medical provider. The above documentation has been reviewed and is accurate and complete.   Elliott, Alicia 02/16/2020

## 2020-02-17 LAB — SARS-COV-2, NAA 2 DAY TAT

## 2020-02-17 LAB — NOVEL CORONAVIRUS, NAA: SARS-CoV-2, NAA: NOT DETECTED

## 2020-03-30 ENCOUNTER — Encounter: Payer: 59 | Admitting: Physician Assistant

## 2020-03-30 NOTE — Progress Notes (Deleted)
I acted as a Neurosurgeon for [employer], PA-C Corky Mull, LPN   Subjective:    Alicia Elliott is a 29 y.o. female and is here for a comprehensive physical exam.  HPI  Health Maintenance Due  Topic Date Due  . PAP SMEAR-Modifier  Never done  . INFLUENZA VACCINE  11/23/2019    Acute Concerns:   Chronic Issues:   Health Maintenance: Immunizations -- *** Colonoscopy -- N/A Mammogram -- N/A PAP -- *** Bone Density -- N/A Diet -- *** Caffeine intake -- *** Sleep habits -- *** Exercise -- *** Current Weight --    Weight History: Wt Readings from Last 10 Encounters:  02/16/20 140 lb (63.5 kg)  12/16/19 137 lb 8 oz (62.4 kg)  11/01/18 174 lb (78.9 kg)  12/27/17 135 lb (61.2 kg)  12/21/17 135 lb 12.8 oz (61.6 kg)  10/03/17 137 lb 12.8 oz (62.5 kg)  04/24/17 169 lb (76.7 kg)   There is no height or weight on file to calculate BMI. Mood -- *** No LMP recorded. (Menstrual status: Irregular Periods). Period characteristics -- *** Birth control -- ***  Depression screen PHQ 2/9 12/16/2019  Decreased Interest 0  Down, Depressed, Hopeless 0  PHQ - 2 Score 0     Other providers/specialists: Patient Care Team: Jarold Motto, [place] as PCP - General (Physician Assistant)   PMHx, SurgHx, SocialHx, Medications, and Allergies were reviewed in the Visit Navigator and updated as appropriate.   Past Medical History:  Diagnosis Date  . Alcoholism /alcohol abuse    stopped in 2015  . Allergy   . Substance abuse (HCC)    experimented with drugs during college  . Vaginal delivery 04/24/2017, 11/02/2018    Past Surgical History:  Procedure Laterality Date  . CYST EXCISION     L side of breast  . HERNIA REPAIR Right 2006   right inguinal hernia repair  . TONSILLECTOMY    . WISDOM TOOTH EXTRACTION      Family History  Problem Relation Age of Onset  . Diabetes Father   . Diverticulitis Father   . Colon cancer Neg Hx   . Stomach cancer Neg Hx   . Esophageal  cancer Neg Hx   . Rectal cancer Neg Hx     Social History   Tobacco Use  . Smoking status: Former Smoker    Types: Cigarettes  . Smokeless tobacco: Never Used  . Tobacco comment: Quit in 2014  Vaping Use  . Vaping Use: Never used  Substance Use Topics  . Alcohol use: Yes    Comment: Glass of wine 2x's month  . Drug use: Not Currently    Comment: Hx in college    Review of Systems:   ROS  Objective:   There were no vitals taken for this visit.  General Appearance:    Alert, cooperative, no distress, appears stated age  Head:    Normocephalic, without obvious abnormality, atraumatic  Eyes:    PERRL, conjunctiva/corneas clear, EOM's intact, fundi    benign, both eyes  Ears:    Normal TM's and external ear canals, both ears  Nose:   Nares normal, septum midline, mucosa normal, no drainage    or sinus tenderness  Throat:   Lips, mucosa, and tongue normal; teeth and gums normal  Neck:   Supple, symmetrical, trachea midline, no adenopathy;    thyroid:  no enlargement/tenderness/nodules; no carotid   bruit or JVD  Back:     Symmetric, no curvature, ROM  normal, no CVA tenderness  Lungs:     Clear to auscultation bilaterally, respirations unlabored  Chest Wall:    No tenderness or deformity   Heart:    Regular rate and rhythm, S1 and S2 normal, no murmur, rub   or gallop  Breast Exam:    No tenderness, masses, or nipple abnormality  Abdomen:     Soft, non-tender, bowel sounds active all four quadrants,    no masses, no organomegaly  Genitalia:    Normal female without lesion, discharge or tenderness  Rectal:    Normal tone no masses or tenderness  Extremities:   Extremities normal, atraumatic, no cyanosis or edema  Pulses:   2+ and symmetric all extremities  Skin:   Skin color, texture, turgor normal, no rashes or lesions  Lymph nodes:   Cervical, supraclavicular, and axillary nodes normal  Neurologic:   CNII-XII intact, normal strength, sensation and reflexes    throughout    Results for orders placed or performed in visit on 02/16/20  Novel Coronavirus, NAA ([facility])   Specimen: Nasopharyngeal(NP) swabs in vial transport medium   Nasopharynge  Result Value Ref Range   SARS-CoV-2, NAA Not Detected Not Detected  SARS-COV-2, NAA 2 DAY TAT   Nasopharynge  Result Value Ref Range   SARS-CoV-2, NAA 2 DAY TAT Performed     Assessment/Plan:   There are no diagnoses linked to this encounter.  Well Adult Exam: Labs ordered: {Yes/No:18319::"Yes"}. Patient counseling was done. See below for items discussed. Discussed the patient's BMI. The {BMI plan (MU NQF measure 421):19504} Follow up {follow-up interval:13814}.  Patient Counseling:   [x]     Nutrition: Stressed importance of moderation in sodium/caffeine intake, saturated fat and cholesterol, caloric balance, sufficient intake of fresh fruits, vegetables, fiber, calcium, iron, and 1 mg of folate supplement per day (for females capable of pregnancy).   [x]      Stressed the importance of regular exercise.    [x]     Substance Abuse: Discussed cessation/primary prevention of tobacco, alcohol, or other drug use; driving or other dangerous activities under the influence; availability of treatment for abuse.    [x]      Injury prevention: Discussed safety belts, safety helmets, smoke detector, smoking near bedding or upholstery.    [x]      Sexuality: Discussed sexually transmitted diseases, partner selection, use of condoms, avoidance of unintended pregnancy  and contraceptive alternatives.    [x]     Dental health: Discussed importance of regular tooth brushing, flossing, and dental visits.   [x]      Health maintenance and immunizations reviewed. Please refer to Health maintenance section.   ***  , PA-C [place] [facility]

## 2020-05-14 ENCOUNTER — Encounter (HOSPITAL_COMMUNITY): Payer: Self-pay

## 2020-05-14 ENCOUNTER — Emergency Department (HOSPITAL_COMMUNITY): Payer: No Typology Code available for payment source

## 2020-05-14 ENCOUNTER — Other Ambulatory Visit: Payer: Self-pay

## 2020-05-14 ENCOUNTER — Emergency Department (HOSPITAL_COMMUNITY)
Admission: EM | Admit: 2020-05-14 | Discharge: 2020-05-14 | Disposition: A | Discharge status: Home / Self Care | Payer: No Typology Code available for payment source | Attending: Emergency Medicine | Admitting: Emergency Medicine

## 2020-05-14 DIAGNOSIS — M549 Dorsalgia, unspecified: Secondary | ICD-10-CM | POA: Insufficient documentation

## 2020-05-14 DIAGNOSIS — Z87891 Personal history of nicotine dependence: Secondary | ICD-10-CM | POA: Insufficient documentation

## 2020-05-14 DIAGNOSIS — Y92410 Unspecified street and highway as the place of occurrence of the external cause: Secondary | ICD-10-CM | POA: Insufficient documentation

## 2020-05-14 DIAGNOSIS — M542 Cervicalgia: Secondary | ICD-10-CM | POA: Diagnosis not present

## 2020-05-14 MED ORDER — ACETAMINOPHEN 500 MG PO TABS
1000.0000 mg | ORAL_TABLET | Freq: Once | ORAL | Status: AC
  Administered 2020-05-14: 1000 mg via ORAL
  Filled 2020-05-14: qty 2

## 2020-05-14 MED ORDER — OXYCODONE HCL 5 MG PO TABS
5.0000 mg | ORAL_TABLET | Freq: Once | ORAL | Status: AC
Start: 2020-05-14 — End: 2020-05-14
  Administered 2020-05-14: 5 mg via ORAL
  Filled 2020-05-14: qty 1

## 2020-05-14 NOTE — ED Notes (Signed)
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 No noted distress. Meds provided for pain.
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
## 2020-05-25 ENCOUNTER — Encounter: Payer: Self-pay | Admitting: Physician Assistant

## 2020-05-25 ENCOUNTER — Ambulatory Visit (INDEPENDENT_AMBULATORY_CARE_PROVIDER_SITE_OTHER): Payer: PRIVATE HEALTH INSURANCE | Admitting: Physician Assistant

## 2020-05-25 ENCOUNTER — Other Ambulatory Visit: Payer: Self-pay

## 2020-05-25 VITALS — BP 102/60 | HR 97 | Temp 97.8°F | Ht 67.0 in | Wt 135.5 lb

## 2020-05-25 DIAGNOSIS — S161XXS Strain of muscle, fascia and tendon at neck level, sequela: Secondary | ICD-10-CM | POA: Diagnosis not present

## 2020-05-25 MED ORDER — DICLOFENAC SODIUM 75 MG PO TBEC: 75.0000 mg | DELAYED_RELEASE_TABLET | Freq: Two times a day (BID) | ORAL | 0 refills | Status: DC

## 2020-05-25 MED ORDER — BACLOFEN 10 MG PO TABS: 10.0000 mg | ORAL_TABLET | Freq: Three times a day (TID) | ORAL | 0 refills | Status: DC
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
## 2020-05-25 NOTE — Patient Instructions (Signed)
It was great to see you!  1. Schedule a visit with Sedalia Muta our Physical Therapist for a visit to evaluate your neck pain  2. Start oral diclofenac for a few days. This is an anti-inflammatory and will replace ibuprofen. Due to your history of your heartburn, it would be a good idea to also take an over the counter acid reducer such as prilosec or nexium (generic fine!) while on this medication  3. May use baclofen muscle relaxer as needed. Start first dose at night. May cut in half.  4. Look over exercises and trial what you are comfortable with.  Take care,  Jarold Motto PA-C

## 2020-06-04 ENCOUNTER — Ambulatory Visit (HOSPITAL_BASED_OUTPATIENT_CLINIC_OR_DEPARTMENT_OTHER): Payer: PRIVATE HEALTH INSURANCE | Attending: Physician Assistant | Admitting: Physical Therapy

## 2020-06-04 ENCOUNTER — Other Ambulatory Visit: Payer: Self-pay

## 2020-06-04 ENCOUNTER — Encounter (HOSPITAL_BASED_OUTPATIENT_CLINIC_OR_DEPARTMENT_OTHER): Payer: Self-pay | Admitting: Physical Therapy

## 2020-06-04 DIAGNOSIS — M542 Cervicalgia: Secondary | ICD-10-CM | POA: Insufficient documentation

## 2020-06-04 DIAGNOSIS — M545 Low back pain, unspecified: Secondary | ICD-10-CM | POA: Diagnosis present

## 2020-06-04 DIAGNOSIS — S134XXS Sprain of ligaments of cervical spine, sequela: Secondary | ICD-10-CM | POA: Insufficient documentation

## 2020-06-04 DIAGNOSIS — M6281 Muscle weakness (generalized): Secondary | ICD-10-CM | POA: Insufficient documentation
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
## 2020-06-04 NOTE — Patient Instructions (Signed)
Access Code: [email or web address] URL: [email or web address] Date: 06/04/2020 Prepared by: Vernon Prey April Kirstie Peri  Exercises Supine Cervical Retraction with Towel - 1 x daily - 7 x weekly - 2 sets - 10 reps Seated Scalene Stretch with Towel - 1 x daily - 7 x weekly - 2 sets - 30 sec hold Sternocleidomastoid Stretch - 1 x daily - 7 x weekly - 2 sets - 30 sec hold Doorway Pec Stretch at 90 Degrees Abduction - 1 x daily - 7 x weekly - 2 sets - 30 sec hold

## 2020-07-13 ENCOUNTER — Encounter: Payer: Self-pay | Admitting: Physician Assistant

## 2020-07-13 ENCOUNTER — Ambulatory Visit (INDEPENDENT_AMBULATORY_CARE_PROVIDER_SITE_OTHER): Payer: PRIVATE HEALTH INSURANCE | Admitting: Physician Assistant

## 2020-07-13 ENCOUNTER — Other Ambulatory Visit: Payer: Self-pay

## 2020-07-13 VITALS — BP 110/78 | HR 80 | Temp 98.2°F | Ht 67.0 in | Wt 139.4 lb

## 2020-07-13 DIAGNOSIS — Z136 Encounter for screening for cardiovascular disorders: Secondary | ICD-10-CM

## 2020-07-13 DIAGNOSIS — Z13220 Encounter for screening for lipoid disorders: Secondary | ICD-10-CM

## 2020-07-13 DIAGNOSIS — Z0000 Encounter for general adult medical examination without abnormal findings: Secondary | ICD-10-CM

## 2020-07-13 LAB — CBC WITH DIFFERENTIAL/PLATELET
Basophils Absolute: 0 10*3/uL (ref 0.0–0.1)
Basophils Relative: 0.7 % (ref 0.0–3.0)
Eosinophils Absolute: 0.2 10*3/uL (ref 0.0–0.7)
Eosinophils Relative: 5.8 % — ABNORMAL HIGH (ref 0.0–5.0)
HCT: 36.3 % (ref 36.0–46.0)
Hemoglobin: 12.5 g/dL (ref 12.0–15.0)
Lymphocytes Relative: 24.3 % (ref 12.0–46.0)
Lymphs Abs: 0.9 10*3/uL (ref 0.7–4.0)
MCHC: 34.3 g/dL (ref 30.0–36.0)
MCV: 90 fl (ref 78.0–100.0)
Monocytes Absolute: 0.3 10*3/uL (ref 0.1–1.0)
Monocytes Relative: 8.4 % (ref 3.0–12.0)
Neutro Abs: 2.3 10*3/uL (ref 1.4–7.7)
Neutrophils Relative %: 60.8 % (ref 43.0–77.0)
Platelets: 245 10*3/uL (ref 150.0–400.0)
RBC: 4.04 Mil/uL (ref 3.87–5.11)
RDW: 13.1 % (ref 11.5–15.5)
WBC: 3.7 10*3/uL — ABNORMAL LOW (ref 4.0–10.5)

## 2020-07-13 LAB — COMPREHENSIVE METABOLIC PANEL
ALT: 13 U/L (ref 0–35)
AST: 18 U/L (ref 0–37)
Albumin: 4.5 g/dL (ref 3.5–5.2)
Alkaline Phosphatase: 49 U/L (ref 39–117)
BUN: 17 mg/dL (ref 6–23)
CO2: 28 mEq/L (ref 19–32)
Calcium: 9.4 mg/dL (ref 8.4–10.5)
Chloride: 104 mEq/L (ref 96–112)
Creatinine, Ser: 0.77 mg/dL (ref 0.40–1.20)
GFR: 103.14 mL/min (ref >60.00)
Glucose, Bld: 75 mg/dL (ref 70–99)
Potassium: 4.2 mEq/L (ref 3.5–5.1)
Sodium: 139 mEq/L (ref 135–145)
Total Bilirubin: 0.6 mg/dL (ref 0.2–1.2)
Total Protein: 6.6 g/dL (ref 6.0–8.3)

## 2020-07-13 LAB — LIPID PANEL
Cholesterol: 158 mg/dL (ref 0–200)
HDL: 61.1 mg/dL (ref >39.00)
LDL Cholesterol: 80 mg/dL (ref 0–99)
NonHDL: 96.42
Total CHOL/HDL Ratio: 3
Triglycerides: 80 mg/dL (ref 0.0–149.0)
VLDL: 16 mg/dL (ref 0.0–40.0)

## 2020-07-13 NOTE — Patient Instructions (Signed)
It was great to see you!  Please go to the lab for blood work.   Our office will call you with your results unless you have chosen to receive results via MyChart.  If your blood work is normal we will follow-up each year for physicals and as scheduled for chronic medical problems.  If anything is abnormal we will treat accordingly and get you in for a follow-up.  Take care,  Alicia Elliott    Health Maintenance, Female Adopting a healthy lifestyle and getting preventive care are important in promoting health and wellness. Ask your health care provider about:  The right schedule for you to have regular tests and exams.  Things you can do on your own to prevent diseases and keep yourself healthy. What should I know about diet, weight, and exercise? Eat a healthy diet  Eat a diet that includes plenty of vegetables, fruits, low-fat dairy products, and lean protein.  Do not eat a lot of foods that are high in solid fats, added sugars, or sodium.   Maintain a healthy weight Body mass index (BMI) is used to identify weight problems. It estimates body fat based on height and weight. Your health care provider can help determine your BMI and help you achieve or maintain a healthy weight. Get regular exercise Get regular exercise. This is one of the most important things you can do for your health. Most adults should:  Exercise for at least 150 minutes each week. The exercise should increase your heart rate and make you sweat (moderate-intensity exercise).  Do strengthening exercises at least twice a week. This is in addition to the moderate-intensity exercise.  Spend less time sitting. Even light physical activity can be beneficial. Watch cholesterol and blood lipids Have your blood tested for lipids and cholesterol at 31 years of age, then have this test every 5 years. Have your cholesterol levels checked more often if:  Your lipid or cholesterol levels are high.  You are older than 31  years of age.  You are at high risk for heart disease. What should I know about cancer screening? Depending on your health history and family history, you may need to have cancer screening at various ages. This may include screening for:  Breast cancer.  Cervical cancer.  Colorectal cancer.  Skin cancer.  Lung cancer. What should I know about heart disease, diabetes, and high blood pressure? Blood pressure and heart disease  High blood pressure causes heart disease and increases the risk of stroke. This is more likely to develop in people who have high blood pressure readings, are of African descent, or are overweight.  Have your blood pressure checked: ? Every 3-5 years if you are 18-39 years of age. ? Every year if you are 40 years old or older. Diabetes Have regular diabetes screenings. This checks your fasting blood sugar level. Have the screening done:  Once every three years after age 40 if you are at a normal weight and have a low risk for diabetes.  More often and at a younger age if you are overweight or have a high risk for diabetes. What should I know about preventing infection? Hepatitis B If you have a higher risk for hepatitis B, you should be screened for this virus. Talk with your health care provider to find out if you are at risk for hepatitis B infection. Hepatitis C Testing is recommended for:  Everyone born from 1945 through 1965.  Anyone with known risk factors for hepatitis C.   Sexually transmitted infections (STIs)  Get screened for STIs, including gonorrhea and chlamydia, if: ? You are sexually active and are younger than 31 years of age. ? You are older than 31 years of age and your health care provider tells you that you are at risk for this type of infection. ? Your sexual activity has changed since you were last screened, and you are at increased risk for chlamydia or gonorrhea. Ask your health care provider if you are at risk.  Ask your health  care provider about whether you are at high risk for HIV. Your health care provider may recommend a prescription medicine to help prevent HIV infection. If you choose to take medicine to prevent HIV, you should first get tested for HIV. You should then be tested every 3 months for as long as you are taking the medicine. Pregnancy  If you are about to stop having your period (premenopausal) and you may become pregnant, seek counseling before you get pregnant.  Take 400 to 800 micrograms (mcg) of folic acid every day if you become pregnant.  Ask for birth control (contraception) if you want to prevent pregnancy. Osteoporosis and menopause Osteoporosis is a disease in which the bones lose minerals and strength with aging. This can result in bone fractures. If you are 65 years old or older, or if you are at risk for osteoporosis and fractures, ask your health care provider if you should:  Be screened for bone loss.  Take a calcium or vitamin D supplement to lower your risk of fractures.  Be given hormone replacement therapy (HRT) to treat symptoms of menopause. Follow these instructions at home: Lifestyle  Do not use any products that contain nicotine or tobacco, such as cigarettes, e-cigarettes, and chewing tobacco. If you need help quitting, ask your health care provider.  Do not use street drugs.  Do not share needles.  Ask your health care provider for help if you need support or information about quitting drugs. Alcohol use  Do not drink alcohol if: ? Your health care provider tells you not to drink. ? You are pregnant, may be pregnant, or are planning to become pregnant.  If you drink alcohol: ? Limit how much you use to 0-1 drink a day. ? Limit intake if you are breastfeeding.  Be aware of how much alcohol is in your drink. In the [place] one drink equals one 12 oz bottle of beer (355 mL), one 5 oz glass of wine (148 mL), or one 1 oz glass of hard liquor (44 mL). General  instructions  Schedule regular health, dental, and eye exams.  Stay current with your vaccines.  Tell your health care provider if: ? You often feel depressed. ? You have ever been abused or do not feel safe at home. Summary  Adopting a healthy lifestyle and getting preventive care are important in promoting health and wellness.  Follow your health care provider's instructions about healthy diet, exercising, and getting tested or screened for diseases.  Follow your health care provider's instructions on monitoring your cholesterol and blood pressure. This information is not intended to replace advice given to you by your health care provider. Make sure you discuss any questions you have with your health care provider. Document Revised: 04/03/2018 Document Reviewed: 04/03/2018 Elsevier Patient Education  2021 Elsevier Inc.  
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
## 2020-07-19 ENCOUNTER — Encounter: Payer: Self-pay | Admitting: Physician Assistant

## 2021-07-15 ENCOUNTER — Encounter: Payer: Self-pay | Admitting: Physician Assistant

## 2021-07-15 ENCOUNTER — Ambulatory Visit (INDEPENDENT_AMBULATORY_CARE_PROVIDER_SITE_OTHER): Payer: PRIVATE HEALTH INSURANCE | Admitting: Physician Assistant

## 2021-07-15 ENCOUNTER — Ambulatory Visit: Payer: PRIVATE HEALTH INSURANCE

## 2021-07-15 VITALS — Temp 98.6°F | Wt 137.4 lb

## 2021-07-15 DIAGNOSIS — R002 Palpitations: Secondary | ICD-10-CM

## 2021-07-15 LAB — COMPREHENSIVE METABOLIC PANEL
ALT: 11 U/L (ref 0–35)
AST: 17 U/L (ref 0–37)
Albumin: 4.5 g/dL (ref 3.5–5.2)
Alkaline Phosphatase: 39 U/L (ref 39–117)
BUN: 10 mg/dL (ref 6–23)
CO2: 31 mEq/L (ref 19–32)
Calcium: 9.5 mg/dL (ref 8.4–10.5)
Chloride: 105 mEq/L (ref 96–112)
Creatinine, Ser: 0.82 mg/dL (ref 0.40–1.20)
GFR: 94.97 mL/min (ref >60.00)
Glucose, Bld: 92 mg/dL (ref 70–99)
Potassium: 4.1 mEq/L (ref 3.5–5.1)
Sodium: 141 mEq/L (ref 135–145)
Total Bilirubin: 0.5 mg/dL (ref 0.2–1.2)
Total Protein: 6.7 g/dL (ref 6.0–8.3)

## 2021-07-15 LAB — CBC WITH DIFFERENTIAL/PLATELET
Basophils Absolute: 0 10*3/uL (ref 0.0–0.1)
Basophils Relative: 0.5 % (ref 0.0–3.0)
Eosinophils Absolute: 0.1 10*3/uL (ref 0.0–0.7)
Eosinophils Relative: 2.1 % (ref 0.0–5.0)
HCT: 36.6 % (ref 36.0–46.0)
Hemoglobin: 12.4 g/dL (ref 12.0–15.0)
Lymphocytes Relative: 22.1 % (ref 12.0–46.0)
Lymphs Abs: 1.5 10*3/uL (ref 0.7–4.0)
MCHC: 34 g/dL (ref 30.0–36.0)
MCV: 91.5 fl (ref 78.0–100.0)
Monocytes Absolute: 0.4 10*3/uL (ref 0.1–1.0)
Monocytes Relative: 5.9 % (ref 3.0–12.0)
Neutro Abs: 4.6 10*3/uL (ref 1.4–7.7)
Neutrophils Relative %: 69.4 % (ref 43.0–77.0)
Platelets: 227 10*3/uL (ref 150.0–400.0)
RBC: 4.01 Mil/uL (ref 3.87–5.11)
RDW: 12.5 % (ref 11.5–15.5)
WBC: 6.6 10*3/uL (ref 4.0–10.5)

## 2021-07-15 LAB — POCT URINE PREGNANCY: Preg Test, Ur: NEGATIVE

## 2021-07-15 NOTE — Progress Notes (Signed)
Alicia Elliott is a 31 y.o. female here for a follow up of a pre-existing problem. ? ?History of Present Illness:  ? ?Chief Complaint  ?Patient presents with  ? Palpitations  ?  Chest tightness and palpitations over the last several weeks ?Some blurriness when getting up from sitting or laying down    ? ? ?HPI ? ?Chest Tightness ?Alicia Elliott states she has been experiencing chest tightness that has been onset for a couple of weeks. Pt describes this to feel as though her heart is skipping a beat accompanied by a tightness in her chest before resuming as normal which occurs daily, sometimes constantly for an hour. Initially she believed this to be caused by stress or increased anxiety due to noticing it often occurs more when she is dealing with things at work or with her children. Upon further discussion she does admit she hasn't been getting as much sleep due to her husband's worsening snoring and she has been more stressed at work. Pt has also been experiencing what could be described as lightheadedness upon standing quickly, but she is not as concerned about this due to being unsure if she is drinking enough water daily. Although she expresses she does not find this to be particularly worrisome, she thought it would be best to have this further evaluated.  ? ?Denies CP or SOB upon exertion, increased alcohol/caffeine intake, blurred vision, LE Swelling, or numbness on one side of body.  ? ?Past Medical History:  ?Diagnosis Date  ? Alcoholism /alcohol abuse   ? stopped in 2015  ? Allergy   ? Substance abuse ([place])   ? experimented with drugs during college  ? Vaginal delivery 04/24/2017, 11/02/2018  ? ?  ?Social History  ? ?Tobacco Use  ? Smoking status: Former  ?  Types: Cigarettes  ? Smokeless tobacco: Never  ? Tobacco comments:  ?  Quit in 2014  ?Vaping Use  ? Vaping Use: Never used  ?Substance Use Topics  ? Alcohol use: Yes  ?  Comment: Glass of wine 2x's month  ? Drug use: Not Currently  ?  Comment: Hx in college   ? ? ?Past Surgical History:  ?Procedure Laterality Date  ? CYST EXCISION    ? L side of breast  ? HERNIA REPAIR Right 2006  ? right inguinal hernia repair  ? TONSILLECTOMY    ? WISDOM TOOTH EXTRACTION    ? ? ?Family History  ?Problem Relation Age of Onset  ? Diverticulitis Father   ? Colon cancer Neg Hx   ? Stomach cancer Neg Hx   ? Esophageal cancer Neg Hx   ? Rectal cancer Neg Hx   ? Breast cancer Neg Hx   ? ? ?No Known Allergies ? ?Current Medications:  ? ?Current Outpatient Medications:  ?  acetaminophen (TYLENOL) 325 MG tablet, Take 2 tablets (650 mg total) by mouth every 6 (six) hours as needed (for pain scale < 4)., Disp: 60 tablet, Rfl: 0 ?  etonogestrel (NEXPLANON) 68 MG IMPL implant, Nexplanon 68 mg subdermal implant  Inject 1 implant by subcutaneous route as directed., Disp: , Rfl:  ?  ibuprofen (ADVIL) 600 MG tablet, Take 1 tablet (600 mg total) by mouth every 6 (six) hours., Disp: 30 tablet, Rfl: 0  ? ?Review of Systems:  ? ?ROS ?Negative unless otherwise specified per HPI. ? ?Vitals:  ? ?Vitals:  ? 07/15/21 1146  ?Temp: 98.6 ?F (37 ?C)  ?TempSrc: Temporal  ?SpO2: 98%  ?Weight: 137 lb 6.4 oz (  62.3 kg)  ?   ?Body mass index is 21.52 kg/m?. ? ?Orthostatic VS for the past 72 hrs (Last 3 readings): ? Orthostatic BP Patient Position Orthostatic Pulse  ?07/15/21 1149 115/79 Standing 90  ?07/15/21 1148 129/85 Sitting 75  ?07/15/21 1146 105/66 Supine 74  ? ? ? ? ? ?Physical Exam:  ? ?Physical Exam ?Vitals and nursing note reviewed.  ?Constitutional:   ?   General: She is not in acute distress. ?   Appearance: She is well-developed. She is not ill-appearing or toxic-appearing.  ?Cardiovascular:  ?   Rate and Rhythm: Normal rate and regular rhythm.  ?   Pulses: Normal pulses.  ?   Heart sounds: Normal heart sounds, S1 normal and S2 normal.  ?Pulmonary:  ?   Effort: Pulmonary effort is normal.  ?   Breath sounds: Normal breath sounds.  ?Skin: ?   General: Skin is warm and dry.  ?Neurological:  ?   Mental Status:  She is alert.  ?   GCS: GCS eye subscore is 4. GCS verbal subscore is 5. GCS motor subscore is 6.  ?Psychiatric:     ?   Speech: Speech normal.     ?   Behavior: Behavior normal. Behavior is cooperative.  ? ?Results for orders placed or performed in visit on 07/15/21  ?POCT urine pregnancy  ?Result Value Ref Range  ? Preg Test, Ur Negative Negative  ? ? ?Assessment and Plan:  ? ?Heart palpitations ?No red flags on discussion ?Urine pregnancy test negative ?Orthostatics negative ?No obvious etiology -- suspect possibly anxiety or benign PACs/PVCs ?EKG tracing is personally reviewed.  EKG notes NSR.  No acute changes.  ?Update labs, will make recommendations accordingly ?Will order zio patch for patient for further evaluation ?Encouraged patient in taking at least 64 oz of water daily  ?Recommended consideration of sleeping in separate room from spouse to ensure adequate amounts of sleep nightly  ?Follow up after testing if any concerns persist, sooner if concerns  ? ?I,Havlyn C Ratchford,acting as a scribe for [employer], PA.,have documented all relevant documentation on the behalf of Inda Coke, PA,as directed by  Inda Coke, PA while in the presence of Inda Coke, [place]. ? ?IInda Coke, PA, have reviewed all documentation for this visit. The documentation on 07/15/21 for the exam, diagnosis, procedures, and orders are all accurate and complete. ? ?Inda Coke, PA-C ? ?

## 2021-07-15 NOTE — Patient Instructions (Addendum)
It was great to see you! ? ?EKG looks reassuring ? ?We are ordering a heart monitor for you to wear for one week -- you will be contacted about getting this by the cardiology office ? ?We will also update blood work to make sure that nothing else is contributing to your symptoms ? ?If new symptoms or concerns arise in the meantime, let me know ? ?Take care, ? ?Inda Coke PA-C  ?

## 2021-07-15 NOTE — Progress Notes (Unsigned)
Enrolled patient for a 7 day Zio XT monitor to be mailed to patients home.  

## 2021-07-19 LAB — IBC + FERRITIN
Ferritin: 34.7 ng/mL (ref 10.0–291.0)
Iron: 67 ug/dL (ref 42–145)
Saturation Ratios: 21.6 % (ref 20.0–50.0)
TIBC: 310.8 ug/dL (ref 250.0–450.0)
Transferrin: 222 mg/dL (ref 212.0–360.0)

## 2021-07-19 LAB — TSH: TSH: 1.75 u[IU]/mL (ref 0.35–5.50)

## 2021-07-22 ENCOUNTER — Ambulatory Visit: Payer: PRIVATE HEALTH INSURANCE | Admitting: Physician Assistant

## 2021-08-18 ENCOUNTER — Telehealth: Payer: Self-pay | Admitting: *Deleted

## 2021-08-18 NOTE — Telephone Encounter (Signed)
This is Alicia Elliott calling from the monitor department at [facility].  Jarold Motto, PA had ordered a 7 day ZIO XT monitor to record your heart rate and rhythm.   ?We received notification from [employer] this device was returned to their facility with no data on the recorder.  Could you please contact our office  at [phone] or send My Chart message to confirm you have declined using the monitor. ? ?

## 2022-03-22 ENCOUNTER — Encounter: Payer: Self-pay | Admitting: Family

## 2022-03-22 ENCOUNTER — Ambulatory Visit (INDEPENDENT_AMBULATORY_CARE_PROVIDER_SITE_OTHER): Payer: PRIVATE HEALTH INSURANCE | Admitting: Family

## 2022-03-22 VITALS — BP 110/74 | HR 81 | Temp 97.8°F | Ht 67.0 in | Wt 142.4 lb

## 2022-03-22 DIAGNOSIS — R059 Cough, unspecified: Secondary | ICD-10-CM | POA: Diagnosis not present

## 2022-03-22 LAB — POC COVID19 BINAXNOW: SARS Coronavirus 2 Ag: NEGATIVE

## 2022-03-22 MED ORDER — HYDROCODONE BIT-HOMATROP MBR 5-1.5 MG/5ML PO SOLN: 5.0000 mL | Freq: Four times a day (QID) | ORAL | 0 refills | Status: DC | PRN

## 2022-03-22 MED ORDER — METHYLPREDNISOLONE ACETATE 80 MG/ML IJ SUSP
80.0000 mg | Freq: Once | INTRAMUSCULAR | Status: AC
  Administered 2022-03-22: 80 mg via INTRAMUSCULAR

## 2022-03-22 NOTE — Progress Notes (Signed)
Patient ID: Alicia Elliott, female    DOB: 10/29/1990, 31 y.o.   MRN: [id number]  Chief Complaint  Patient presents with   Cough    Dry cough, Chest congestion     HPI:      Cough:  coughing & congested for about a week. had some sinus sx beginning of week, now cough is persisting and she c/o chest pleuritic pain. Denies fever or sore throat, has not tested for Covid.      Assessment & Plan:  1. Cough, unspecified type - rapid covid neg. lungs clear, pt given steroid injection to help with sx, sending cough syrup, advised on use & SE. Increase fluids, take OTC Ibuprofen or sinus meds prn.  - POC COVID-19 BinaxNow - methylPREDNISolone acetate (DEPO-MEDROL) injection 80 mg - HYDROcodone bit-homatropine (HYCODAN) 5-1.5 MG/5ML syrup; Take 5 mLs by mouth every 6 (six) hours as needed for cough (Do not drive if taking during the day.).  Dispense: 120 mL; Refill: 0    Subjective:    Outpatient Medications Prior to Visit  Medication Sig Dispense Refill   acetaminophen (TYLENOL) 325 MG tablet Take 2 tablets (650 mg total) by mouth every 6 (six) hours as needed (for pain scale < 4). 60 tablet 0   etonogestrel (NEXPLANON) 68 MG IMPL implant Nexplanon 68 mg subdermal implant  Inject 1 implant by subcutaneous route as directed.     ibuprofen (ADVIL) 600 MG tablet Take 1 tablet (600 mg total) by mouth every 6 (six) hours. 30 tablet 0   No facility-administered medications prior to visit.   Past Medical History:  Diagnosis Date   Alcoholism /alcohol abuse    stopped in 2015   Allergy    Substance abuse (HCC)    experimented with drugs during college   Vaginal delivery 04/24/2017, 11/02/2018   Past Surgical History:  Procedure Laterality Date   CYST EXCISION     L side of breast   HERNIA REPAIR Right 2006   right inguinal hernia repair   TONSILLECTOMY     WISDOM TOOTH EXTRACTION     No Known Allergies    Objective:    Physical Exam Vitals and nursing note reviewed.   Constitutional:      Appearance: Normal appearance. She is ill-appearing.     Interventions: Face mask in place.  HENT:     Right Ear: Tympanic membrane and ear canal normal.     Left Ear: Tympanic membrane and ear canal normal.     Nose:     Right Sinus: No frontal sinus tenderness.     Left Sinus: No frontal sinus tenderness.     Mouth/Throat:     Mouth: Mucous membranes are moist.     Pharynx: Posterior oropharyngeal erythema present. No pharyngeal swelling, oropharyngeal exudate or uvula swelling.     Tonsils: No tonsillar exudate or tonsillar abscesses.  Cardiovascular:     Rate and Rhythm: Normal rate and regular rhythm.  Pulmonary:     Effort: Pulmonary effort is normal.     Breath sounds: Normal breath sounds.  Musculoskeletal:        General: Normal range of motion.  Lymphadenopathy:     Head:     Right side of head: No preauricular or posterior auricular adenopathy.     Left side of head: No preauricular or posterior auricular adenopathy.     Cervical: No cervical adenopathy.  Skin:    General: Skin is warm and dry.  Neurological:  Mental Status: She is alert.  Psychiatric:        Mood and Affect: Mood normal.        Behavior: Behavior normal.    BP 110/74   Pulse 81   Temp 97.8 F (36.6 C) (Temporal)   Ht 5\' 7"  (1.702 m)   Wt 142 lb 6.4 oz (64.6 kg)   SpO2 97%   BMI 22.30 kg/m  Wt Readings from Last 3 Encounters:  03/22/22 142 lb 6.4 oz (64.6 kg)  07/15/21 137 lb 6.4 oz (62.3 kg)  07/13/20 139 lb 6.1 oz (63.2 kg)       07/15/20, NP

## 2023-03-16 ENCOUNTER — Encounter: Payer: Self-pay | Admitting: Physician Assistant

## 2023-03-16 NOTE — Telephone Encounter (Signed)
Please see message and advise

## 2023-03-19 NOTE — Progress Notes (Signed)
Alicia Elliott is a 32 y.o. female {ELNP/FU:31256} History of Present Illness:   Health Maintenance Due  Topic Date Due   Cervical Cancer Screening (HPV/Pap Cotest)  04/15/2021   INFLUENZA VACCINE  11/23/2022   No chief complaint on file.  HPI ***: On ***/***  ***  ***  ***   ***  ***  ***  ***  *** Past Medical History:  Diagnosis Date   Alcoholism /alcohol abuse    stopped in 2015   Allergy    Substance abuse (HCC)    experimented with drugs during college   Vaginal delivery 04/24/2017, 11/02/2018    Social History   Tobacco Use   Smoking status: Former    Types: Cigarettes   Smokeless tobacco: Never   Tobacco comments:    Quit in 2014  Vaping Use   Vaping status: Never Used  Substance Use Topics   Alcohol use: Yes    Comment: Glass of wine 2x's month   Drug use: Not Currently    Comment: Hx in college   Past Surgical History:  Procedure Laterality Date   CYST EXCISION     L side of breast   HERNIA REPAIR Right 2006   right inguinal hernia repair   TONSILLECTOMY     WISDOM TOOTH EXTRACTION     Family History  Problem Relation Age of Onset   Diverticulitis Father    Colon cancer Neg Hx    Stomach cancer Neg Hx    Esophageal cancer Neg Hx    Rectal cancer Neg Hx    Breast cancer Neg Hx    No Known Allergies Current Medications:   Current Outpatient Medications:    acetaminophen (TYLENOL) 325 MG tablet, Take 2 tablets (650 mg total) by mouth every 6 (six) hours as needed (for pain scale < 4)., Disp: 60 tablet, Rfl: 0   etonogestrel (NEXPLANON) 68 MG IMPL implant, Nexplanon 68 mg subdermal implant  Inject 1 implant by subcutaneous route as directed., Disp: , Rfl:    HYDROcodone bit-homatropine (HYCODAN) 5-1.5 MG/5ML syrup, Take 5 mLs by mouth every 6 (six) hours as needed for cough (Do not drive if taking during the day.)., Disp: 120 mL, Rfl: 0   ibuprofen (ADVIL) 600 MG tablet, Take 1 tablet (600 mg total) by mouth every 6 (six) hours.,  Disp: 30 tablet, Rfl: 0  Review of Systems:   ROS See pertinent positives and negatives as per the HPI.  Vitals:   There were no vitals filed for this visit.   There is no height or weight on file to calculate BMI.  Physical Exam:   Physical Exam  Assessment and Plan:   There are no diagnoses linked to this encounter.            I,Emily Lagle,acting as a Neurosurgeon for [employer], PA.,have documented all relevant documentation on the behalf of Jarold Motto, PA,as directed by  Jarold Motto, PA while in the presence of Jarold Motto, [place].  *** (refresh reminder)  I, Jarold Motto, PA, have reviewed all documentation for this visit. The documentation on 03/19/23 for the exam, diagnosis, procedures, and orders are all accurate and complete.  Jarold Motto, PA-C

## 2023-03-20 ENCOUNTER — Ambulatory Visit (INDEPENDENT_AMBULATORY_CARE_PROVIDER_SITE_OTHER): Payer: PRIVATE HEALTH INSURANCE | Admitting: Physician Assistant

## 2023-03-20 ENCOUNTER — Ambulatory Visit (INDEPENDENT_AMBULATORY_CARE_PROVIDER_SITE_OTHER): Payer: PRIVATE HEALTH INSURANCE | Admitting: Sports Medicine

## 2023-03-20 ENCOUNTER — Encounter: Payer: Self-pay | Admitting: Physician Assistant

## 2023-03-20 VITALS — BP 110/70 | HR 78 | Temp 98.0°F | Ht 67.0 in | Wt 143.4 lb

## 2023-03-20 VITALS — BP 122/82 | HR 68 | Ht 67.0 in | Wt 143.0 lb

## 2023-03-20 DIAGNOSIS — R55 Syncope and collapse: Secondary | ICD-10-CM

## 2023-03-20 DIAGNOSIS — S060X0A Concussion without loss of consciousness, initial encounter: Secondary | ICD-10-CM

## 2023-03-20 DIAGNOSIS — S060XAA Concussion with loss of consciousness status unknown, initial encounter: Secondary | ICD-10-CM | POA: Diagnosis not present

## 2023-03-20 LAB — COMPREHENSIVE METABOLIC PANEL
ALT: 9 U/L (ref 0–35)
AST: 14 U/L (ref 0–37)
Albumin: 4.7 g/dL (ref 3.5–5.2)
Alkaline Phosphatase: 36 U/L — ABNORMAL LOW (ref 39–117)
BUN: 10 mg/dL (ref 6–23)
CO2: 28 meq/L (ref 19–32)
Calcium: 9.4 mg/dL (ref 8.4–10.5)
Chloride: 103 meq/L (ref 96–112)
Creatinine, Ser: 0.87 mg/dL (ref 0.40–1.20)
GFR: 87.42 mL/min (ref >60.00)
Glucose, Bld: 82 mg/dL (ref 70–99)
Potassium: 3.7 meq/L (ref 3.5–5.1)
Sodium: 138 meq/L (ref 135–145)
Total Bilirubin: 0.7 mg/dL (ref 0.2–1.2)
Total Protein: 7.2 g/dL (ref 6.0–8.3)

## 2023-03-20 LAB — CBC WITH DIFFERENTIAL/PLATELET
Basophils Absolute: 0 10*3/uL (ref 0.0–0.1)
Basophils Relative: 0.7 % (ref 0.0–3.0)
Eosinophils Absolute: 0.1 10*3/uL (ref 0.0–0.7)
Eosinophils Relative: 1.8 % (ref 0.0–5.0)
HCT: 39.4 % (ref 36.0–46.0)
Hemoglobin: 13.2 g/dL (ref 12.0–15.0)
Lymphocytes Relative: 32.2 % (ref 12.0–46.0)
Lymphs Abs: 1.8 10*3/uL (ref 0.7–4.0)
MCHC: 33.6 g/dL (ref 30.0–36.0)
MCV: 94 fL (ref 78.0–100.0)
Monocytes Absolute: 0.3 10*3/uL (ref 0.1–1.0)
Monocytes Relative: 5.1 % (ref 3.0–12.0)
Neutro Abs: 3.3 10*3/uL (ref 1.4–7.7)
Neutrophils Relative %: 60.2 % (ref 43.0–77.0)
Platelets: 294 10*3/uL (ref 150.0–400.0)
RBC: 4.19 Mil/uL (ref 3.87–5.11)
RDW: 12 % (ref 11.5–15.5)
WBC: 5.5 10*3/uL (ref 4.0–10.5)

## 2023-03-20 LAB — POCT URINE PREGNANCY: Preg Test, Ur: NEGATIVE

## 2023-03-20 LAB — TSH: TSH: 1.3 u[IU]/mL (ref 0.35–5.50)

## 2023-03-20 NOTE — Patient Instructions (Signed)
It was great to see you!  Call [phone] to schedule an appointment with Sports Medicine TODAY for [facility] -- they are expecting your call  I will place cardiology referral  If new/worsening symptom(s) in the meantime, please go to the ER   Take care,  Jarold Motto PA-C

## 2023-03-20 NOTE — Patient Instructions (Addendum)
Work note  Recommendations:  -           Relative mental and physical rest for 48 hours after concussive event -Recommend light aerobic activity while keeping symptoms less than 3/10 -Stop mental or physical activities that cause symptoms to worsen greater than 3/10, and wait 24 hours before attempting them again -Eliminate screen time as much as possible for first 48 hours after concussive event, then continue limited screen time (recommend less than 2 hours per day)  Follow up 2 weeks

## 2023-03-20 NOTE — Progress Notes (Signed)
Aleen Sells D.[facility] [address] [place] [address] Phone: [phone]  Assessment and Plan:     1. Concussion without loss of consciousness, initial encounter -Acute, complicated, initial sports medicine visit - Consistent with concussion based on HPI, symptom severity score, special testing - Recommend out of work for the remainder of this week.  May restart work next week taking rest breaks as needed and should go home early if symptoms persist.  Work note provided for 2 weeks or until reevaluated  2. Syncope, unspecified syncope type  -Acute, complicated, initial visit - While patient's current symptoms fit well with diagnosis of concussion, we do not have a clear explanation for her initial syncopal event.  My assumption at this point is that syncopal event was caused by patient using a THC vape pen prior to going to bed, and she is THC nave and had a reaction in the middle of the night when trying to use the restroom that caused a syncope and fall.  Patient has had no similar symptoms since that time, though I advised if similar presyncopal or syncopal event should occur, chest pain or shortness of breath should present, patient should call EMS - Patient states that she previously had a cardiac workup including EKG for occasional palpitations.  Could benefit in the future from cardiac evaluation and possible heart monitor  Date of injury was 03/15/2023.   Original symptom severity scores were 17 and 26. The patient was counseled on the nature of the injury, typical course and potential options for further evaluation and treatment. Discussed the importance of compliance with recommendations. Patient stated understanding of this plan and willingness to comply.  Recommendations:  -  Relative mental and physical rest for 48 hours after concussive event - Recommend light aerobic activity while keeping symptoms less than 3/10 - Stop mental or physical  activities that cause symptoms to worsen greater than 3/10, and wait 24 hours before attempting them again - Eliminate screen time as much as possible for first 48 hours after concussive event, then continue limited screen time (recommend less than 2 hours per day)  Pertinent previous records reviewed include primary care note 03/20/2023  - Encouraged to RTC in 2 weeks for reassessment or sooner for any concerns or acute changes    Time of visit 48 minutes, which included chart review, physical exam, treatment plan, symptom severity score, VOMS, and tandem gait testing being performed, interpreted, and discussed with patient at today's visit.   Subjective:   I, Wilford Grist, am serving as a scribe for Dr. Richardean Sale  Chief Complaint: Syncope and concussion-like symptoms  HPI:   03/20/23 Patient said that she got up to use bathroom in middle of night on Thursday and she was sitting on toilet and passed out landing on the bathroom floor. Patient's husband help sit her up and then she fell again hitting her head. When she finally came to she was nauseous and had full body sweating. Notes that she had some labor breathing. Husband put patient back on toilet and she was unable to see him waving his hands in front of her face. 911 facilitated. Felt very tired and wanted to go back to sleep until EMT arrived. Has headache since accident. 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    Concussion HPI:  - Injury date: 03/15/2023   - Mechanism of injury: Syncope - LOC: Unknown - Initial evaluation: EMS evaluation at home  on 03/15/2023, no ER visit - Previous head injuries/concussions: No    - Previous imaging: No     - Social history: Works at desk on a computer  Hospitalization for head injury? No Diagnosed/treated for headache disorder, migraines, or seizures? No Diagnosed with learning disability Elnita Maxwell? No Diagnosed with ADD/ADHD? No Diagnose with Depression,  anxiety, or other Psychiatric Disorder? No   Current medications:  Current Outpatient Medications  Medication Sig Dispense Refill   acetaminophen (TYLENOL) 325 MG tablet Take 2 tablets (650 mg total) by mouth every 6 (six) hours as needed (for pain scale < 4). 60 tablet 0   etonogestrel (NEXPLANON) 68 MG IMPL implant Nexplanon 68 mg subdermal implant  Inject 1 implant by subcutaneous route as directed.     No current facility-administered medications for this visit.      Objective:     Vitals:   03/20/23 1449  BP: 122/82  Pulse: 68  SpO2: 99%  Weight: 143 lb (64.9 kg)  Height: 5\' 7"  (1.702 m)      Body mass index is 22.4 kg/m.    Physical Exam:     General: Well-appearing, cooperative, sitting comfortably in no acute distress.  Psychiatric: Mood and affect are appropriate.   Neuro:sensation intact and strength 5/5 with no deficits, no atrophy, normal muscle tone   Today's Symptom Severity Score:  Scores: 0-6  Headache:4 "Pressure in head":2  Neck Pain:1 Nausea or vomiting:1 Dizziness:0 Blurred vision:0 Balance problems:0 Sensitivity to light:3 Sensitivity to noise:0 Feeling slowed down:1 Feeling like "in a fog":1 "Don't feel right":1 Difficulty concentrating:2 Difficulty remembering:1  Fatigue or low energy:1 Confusion:1  Drowsiness:1  More emotional:2 Irritability:2 Sadness:1  Nervous or Anxious:0 Trouble falling or staying asleep:1  Total number of symptoms: 17/22  Symptom Severity index: 26/132      Full pain-free cervical PROM: yes     Cognitive:  - Months backwards: 0 mistakes.  12 seconds  mVOMS:   - Baseline symptoms: 0 - Horizontal Vestibular-Ocular Reflex: 0/10  - Smooth pursuits: 0/10  - Horizontal Saccades:  0/10  - Visual Motion Sensitivity Test: Dizzy and nausea 7/10  - Convergence: 4, 3 cm (<5 cm normal)    Autonomic:  - Symptomatic with supine to standing: No  Complex Tandem Gait: - Forward, eyes open: 3 errors -  Backward, eyes open: 4 errors - Forward, eyes closed: 8 errors - Backward, eyes closed: 9 errors  Electronically signed by:  Aleen Sells D.[facility] 4:30 PM 03/20/23

## 2023-04-02 NOTE — Progress Notes (Unsigned)
Alicia Elliott [facility] [address] [place] [address] Phone: [phone]  Assessment and Plan:     There are no diagnoses linked to this encounter.  ***    Date of injury was 03/15/2023.  Symptom severity scores of *** and *** today.  Original symptom severity scores were 17 and 26.   Recommendations:  -  Goal of sleeping a minimum of 7-8 continuous hours nightly - Recommend light physical activity for 15-30 minutes a day while keeping symptoms less than 3/10 - Stop mental or physical activities that cause symptoms to worsen greater than 3/10, and wait 24 hours before attempting them again - Eliminate screen time as much as possible for first 48 hours after concussive event, then continue limited screen time (recommend less than 2 hours per day)  Pertinent previous records reviewed include ***    - Encouraged to RTC in *** for reassessment or sooner for any concerns or acute changes    Time of visit *** minutes, which included chart review, physical exam, treatment plan, symptom severity score, VOMS, and tandem gait testing being performed, interpreted, and discussed with patient at today's visit.   Subjective:   I, Adriona Kaney Starling Manns, am serving as a Neurosurgeon for [employer]  Chief Complaint: Syncope and concussion-like symptoms   HPI:    03/20/23 Patient said that she got up to use bathroom in middle of night on Thursday and she was sitting on toilet and passed out landing on the bathroom floor. Patient's husband help sit her up and then she fell again hitting her head. When she finally came to she was nauseous and had full body sweating. Notes that she had some labor breathing. Husband put patient back on toilet and she was unable to see him waving his hands in front of her face. 911 facilitated. Felt very tired and wanted to go back to sleep until EMT arrived. Has headache since accident. Patient back to work yesterday and she stares at  screen all day.   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 No Diagnosed/treated for headache disorder, migraines, or seizures? No Diagnosed with learning disability Elnita Maxwell? No Diagnosed with ADD/ADHD? No Diagnose with Depression, anxiety, or other Psychiatric Disorder? No Current medications:  Current Outpatient Medications  Medication Sig Dispense Refill   acetaminophen (TYLENOL) 325 MG tablet Take 2 tablets (650 mg total) by mouth every 6 (six) hours as needed (for pain scale < 4). 60 tablet 0   etonogestrel (NEXPLANON) 68 MG IMPL implant Nexplanon 68 mg subdermal implant  Inject 1 implant by subcutaneous route as directed.     No current facility-administered medications for this visit.      Objective:     There were no vitals filed for this visit.    There is no height or weight on file to calculate BMI.    Physical Exam:     General: Well-appearing, cooperative, sitting comfortably in no acute distress.  Psychiatric: Mood and affect are appropriate.   Neuro:sensation intact and strength 5/5 with no deficits, no atrophy, normal muscle tone   Today's Symptom Severity Score:  Scores: 0-6  Headache:*** "Pressure in head":***  Neck Pain:*** Nausea or vomiting:*** Dizziness:*** Blurred vision:*** Balance problems:*** Sensitivity to light:*** Sensitivity to noise:*** Feeling slowed down:*** Feeling like "in a fog":*** "Don't feel right":*** Difficulty concentrating:*** Difficulty remembering:***  Fatigue or low energy:*** Confusion:***  Drowsiness:***  More emotional:*** Irritability:*** Sadness:***  Nervous or Anxious:*** Trouble falling or staying  asleep:***  Total number of symptoms: ***/22  Symptom Severity index: ***/132  Worse with physical activity? No*** Worse with mental activity? No*** Percent improved since injury: ***%    Full pain-free cervical PROM: yes***    Cognitive:  - Months backwards: *** Mistakes. *** seconds  mVOMS:   - Baseline symptoms: *** - Horizontal Vestibular-Ocular Reflex: ***/10  - Smooth pursuits: ***/10  - Horizontal Saccades:  ***/10  - Visual Motion Sensitivity Test:  ***/10  - Convergence: ***cm (<5 cm normal)    Autonomic:  - Symptomatic with supine to standing: No***  Complex Tandem Gait: - Forward, eyes open: *** errors - Backward, eyes open: *** errors - Forward, eyes closed: *** errors - Backward, eyes closed: *** errors  Electronically signed by:  Alicia Elliott [facility] 12:23 PM 04/02/23

## 2023-04-03 ENCOUNTER — Encounter: Payer: PRIVATE HEALTH INSURANCE | Admitting: Sports Medicine

## 2023-11-07 DIAGNOSIS — Z3046 Encounter for surveillance of implantable subdermal contraceptive: Secondary | ICD-10-CM | POA: Diagnosis not present

## 2023-12-26 DIAGNOSIS — D225 Melanocytic nevi of trunk: Secondary | ICD-10-CM | POA: Diagnosis not present

## 2023-12-26 DIAGNOSIS — L814 Other melanin hyperpigmentation: Secondary | ICD-10-CM | POA: Diagnosis not present

## 2023-12-26 DIAGNOSIS — L578 Other skin changes due to chronic exposure to nonionizing radiation: Secondary | ICD-10-CM | POA: Diagnosis not present

## 2023-12-26 DIAGNOSIS — L821 Other seborrheic keratosis: Secondary | ICD-10-CM | POA: Diagnosis not present

## 2024-02-22 ENCOUNTER — Ambulatory Visit (INDEPENDENT_AMBULATORY_CARE_PROVIDER_SITE_OTHER): Admitting: Internal Medicine

## 2024-02-22 ENCOUNTER — Encounter: Payer: Self-pay | Admitting: Internal Medicine

## 2024-02-22 VITALS — BP 106/62 | HR 87 | Temp 98.1°F | Ht 67.0 in | Wt 134.6 lb

## 2024-02-22 DIAGNOSIS — R3589 Other polyuria: Secondary | ICD-10-CM | POA: Diagnosis not present

## 2024-02-22 DIAGNOSIS — R110 Nausea: Secondary | ICD-10-CM

## 2024-02-22 DIAGNOSIS — R109 Unspecified abdominal pain: Secondary | ICD-10-CM | POA: Diagnosis not present

## 2024-02-22 DIAGNOSIS — R631 Polydipsia: Secondary | ICD-10-CM

## 2024-02-22 LAB — CBC WITH DIFFERENTIAL/PLATELET
Basophils Absolute: 0 K/uL (ref 0.0–0.1)
Basophils Relative: 0.6 % (ref 0.0–3.0)
Eosinophils Absolute: 0.1 K/uL (ref 0.0–0.7)
Eosinophils Relative: 1.2 % (ref 0.0–5.0)
HCT: 40.8 % (ref 36.0–46.0)
Hemoglobin: 13.7 g/dL (ref 12.0–15.0)
Lymphocytes Relative: 40.4 % (ref 12.0–46.0)
Lymphs Abs: 2 K/uL (ref 0.7–4.0)
MCHC: 33.5 g/dL (ref 30.0–36.0)
MCV: 91.1 fl (ref 78.0–100.0)
Monocytes Absolute: 0.4 K/uL (ref 0.1–1.0)
Monocytes Relative: 7.2 % (ref 3.0–12.0)
Neutro Abs: 2.5 K/uL (ref 1.4–7.7)
Neutrophils Relative %: 50.6 % (ref 43.0–77.0)
Platelets: 257 K/uL (ref 150.0–400.0)
RBC: 4.47 Mil/uL (ref 3.87–5.11)
RDW: 12.1 % (ref 11.5–15.5)
WBC: 5 K/uL (ref 4.0–10.5)

## 2024-02-22 LAB — COMPREHENSIVE METABOLIC PANEL WITH GFR
ALT: 10 U/L (ref 0–35)
AST: 14 U/L (ref 0–37)
Albumin: 5.2 g/dL (ref 3.5–5.2)
Alkaline Phosphatase: 39 U/L (ref 39–117)
BUN: 10 mg/dL (ref 6–23)
CO2: 28 meq/L (ref 19–32)
Calcium: 9.9 mg/dL (ref 8.4–10.5)
Chloride: 102 meq/L (ref 96–112)
Creatinine, Ser: 0.89 mg/dL (ref 0.40–1.20)
GFR: 84.52 mL/min (ref >60.00)
Glucose, Bld: 75 mg/dL (ref 70–99)
Potassium: 4.5 meq/L (ref 3.5–5.1)
Sodium: 138 meq/L (ref 135–145)
Total Bilirubin: 0.8 mg/dL (ref 0.2–1.2)
Total Protein: 7.6 g/dL (ref 6.0–8.3)

## 2024-02-22 LAB — AMYLASE: Amylase: 35 U/L (ref 27–131)

## 2024-02-22 LAB — HEMOGLOBIN A1C: Hgb A1c MFr Bld: 5.3 % (ref 4.6–6.5)

## 2024-02-22 MED ORDER — PROMETHAZINE-DM 6.25-15 MG/5ML PO SYRP: 5.0000 mL | ORAL_SOLUTION | Freq: Four times a day (QID) | ORAL | 0 refills | Status: AC | PRN

## 2024-02-22 MED ORDER — SCOPOLAMINE 1 MG/3DAYS TD PT72: 1.0000 | MEDICATED_PATCH | TRANSDERMAL | 1 refills | Status: AC

## 2024-02-22 MED ORDER — ONDANSETRON HCL 4 MG PO TABS: 4.0000 mg | ORAL_TABLET | Freq: Three times a day (TID) | ORAL | 4 refills | Status: AC | PRN

## 2024-02-22 MED ORDER — METOCLOPRAMIDE HCL 5 MG PO TABS: 5.0000 mg | ORAL_TABLET | Freq: Four times a day (QID) | ORAL | 0 refills | Status: AC

## 2024-02-22 NOTE — Patient Instructions (Addendum)
 It was a pleasure seeing you today! Your health and satisfaction are our top priorities.  Bernardino Cone, MD  VISIT SUMMARY: During your visit, we discussed your persistent nausea, fatigue, and other related symptoms. We reviewed your medical history, including recent changes and family history, and considered several possible causes for your symptoms. We also discussed potential treatments and lifestyle changes to help manage your condition.  YOUR PLAN: -CHRONIC NAUSEA WITH ABDOMINAL CRAMPING, ALTERED BOWEL HABITS, AND FATIGUE: Chronic nausea with abdominal cramping and altered bowel habits can be caused by various conditions, including gastroparesis, small intestinal bacterial overgrowth (SIBO), and functional dyspepsia. We have prescribed medications to help manage your nausea, including Reglan, Zofran , Phenergan, and a scopolamine patch. Please be aware of potential side effects, such as sedation with the scopolamine patch and the risk of tardive dyskinesia with long-term use of Reglan. We have also ordered basic blood work and a urine analysis to rule out other conditions like a urinary tract infection or kidney stones.  -POLYURIA AND POLYDIPSIA, POSSIBLE NEW-ONSET DIABETES MELLITUS: Increased urination and thirst can be signs of diabetes mellitus, a condition where your body has trouble regulating blood sugar levels. Given your symptoms and family history, we are considering this as a possible diagnosis. We have ordered lab work to check for diabetes and will discuss the results with you once they are available.  -UNINTENTIONAL WEIGHT LOSS: Unintentional weight loss can occur due to various reasons, including decreased muscle mass from fatigue and reduced physical activity. While you have not noted significant weight loss, we will monitor this as it may be related to your chronic nausea and altered nutrition.  -SUSPECTED GLUTEN SENSITIVITY: Gluten sensitivity can cause symptoms similar to yours. We  recommend trying a gluten-free diet for two weeks to see if your symptoms improve. We have provided guidance on identifying gluten-containing foods and the benefits of a gluten-free diet.   INSTRUCTIONS: Please follow up with the lab work and urine analysis as soon as possible. We will review the results and discuss the next steps. Additionally, try the gluten-free diet for two weeks and monitor any changes in your symptoms. If you experience any severe side effects from the medications or if your symptoms worsen, please contact our office immediately.  Your Providers PCP: Job Lukes, PA,  [phone]) Referring Provider: Job Lukes, [place],  [phone])  NEXT STEPS: [x]  Early Intervention: Schedule sooner appointment, call our on-call services, or go to emergency room if there is any significant Increase in pain or discomfort New or worsening symptoms Sudden or severe changes in your health [x]  Flexible Follow-Up: We recommend a No follow-ups on file. for optimal routine care. This allows for progress monitoring and treatment adjustments. [x]  Preventive Care: Schedule your annual preventive care visit! It's typically covered by insurance and helps identify potential health issues early. [x]  Lab & X-ray Appointments: Incomplete tests scheduled today, or call to schedule. X-rays: [facility] (M-F, 8:30am-noon or 1pm-5pm). [x]  Medical Information Release: Sign a release form at front desk to obtain relevant medical information we don't have.  MAKING THE MOST OF OUR FOCUSED 20 MINUTE APPOINTMENTS: [x]   Clearly state your top concerns at the beginning of the visit to focus our discussion [x]   If you anticipate you will need more time, please inform the front desk during scheduling - we can book multiple appointments in the same week. [x]   If you have transportation problems- use our convenient video appointments or ask about transportation support. [x]   We  can get  down to business faster if you use MyChart to update information before the visit and submit non-urgent questions before your visit. Thank you for taking the time to provide details through MyChart.  Let our nurse know and she can import this information into your encounter documents.  Arrival and Wait Times: [x]   Arriving on time ensures that everyone receives prompt attention. [x]   Early morning (8a) and afternoon (1p) appointments tend to have shortest wait times. [x]   Unfortunately, we cannot delay appointments for late arrivals or hold slots during phone calls.  Getting Answers and Following Up [x]   Simple Questions & Concerns: For quick questions or basic follow-up after your visit, reach us  at [phone] or MyChart messaging. [x]   Complex Concerns: If your concern is more complex, scheduling an appointment might be best. Discuss this with the staff to find the most suitable option. [x]   Lab & Imaging Results: We'll contact you directly if results are abnormal or you don't use MyChart. Most normal results will be on MyChart within 2-3 business days, with a review message from Dr. Jesus. Haven't heard back in 2 weeks? Need results sooner? Contact us  at [phone]. [x]   Referrals: Our referral coordinator will manage specialist referrals. The specialist's office should contact you within 2 weeks to schedule an appointment. Call us  if you haven't heard from them after 2 weeks.  Staying Connected [x]   MyChart: Activate your MyChart for the fastest way to access results and message us . See the last page of this paperwork for instructions on how to activate.  Bring to Your Next Appointment [x]   Medications: Please bring all your medication bottles to your next appointment to ensure we have an accurate record of your prescriptions. [x]   Health Diaries: If you're monitoring any health conditions at home, keeping a diary of your readings can be very helpful for discussions at your next  appointment.  Billing [x]   X-ray & Lab Orders: These are billed by separate companies. Contact the invoicing company directly for questions or concerns. [x]   Visit Charges: Discuss any billing inquiries with our administrative services team.  Your Satisfaction Matters [x]   Share Your Experience: We strive for your satisfaction! If you have any complaints, or preferably compliments, please let Dr. Jesus know directly or contact our Practice Administrators, Manuelita Rubin or [employer], by asking at the front desk.   Reviewing Your Records [x]   Review this early draft of your clinical encounter notes below and the final encounter summary tomorrow on MyChart after its been completed.  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 This diagnosis is supported by the chronicity, lack of clear relation to meals or activity, and the presence of upper GI symptoms without evidence of organic disease. Functional dyspepsia often presents with postprandial fullness, early satiety, and epigastric discomfort, and is a diagnosis of exclusion.[1]  2. Gastroparesis: The sensation of food "sitting like rocks" after larger meals, early satiety, and muscle mass loss suggest possible delayed gastric emptying. Gastroparesis can present with chronic nausea, postprandial discomfort, and sometimes weight loss or nutritional compromise, even in the absence of vomiting.[2]  3. Small Intestinal Bacterial Overgrowth (  SIBO): SIBO can cause nausea, bloating, cramping, inconsistent bowel habits, and fatigue. The fluctuating bowel movements and fatigue are supportive, though SIBO is  often indistinguishable from other functional GI disorders without specific testing.[3]  4. Celiac Disease: Chronic GI symptoms, muscle mass loss, and fatigue could reflect malabsorption. Celiac disease can present with non-specific GI complaints, inconsistent bowel habits, and extraintestinal symptoms such as fatigue and muscle wasting, even in the absence of classic diarrhea.[4]  5. Irritable Bowel Syndrome (IBS): While IBS typically features intermittent rather than constant pain, the variable bowel habits and cramping could fit, though the lack of pain relief with defecation and the constant nature of symptoms make this less likely.[5]  Most Important Not to Miss Diagnoses:  1. Gastric or Upper GI Malignancy: Although rare at this age and without alarm features (no GI bleeding, no persistent vomiting, no significant weight loss), malignancy should be considered if symptoms persist or worsen, especially with muscle mass loss.[6]  2. Small-Bowel Obstruction: The absence of vomiting, severe pain, and the chronic, non-acute course make obstruction unlikely, but persistent postprandial symptoms and the sensation of food not moving could warrant imaging if symptoms acutely worsen.[7]  3. Pancreatic Cancer: Uncommon in this age group and without classic features (jaundice, steatorrhea, new-onset diabetes), but unexplained muscle mass loss and persistent GI symptoms should prompt consideration if other causes are excluded.[8]  Key Additional History and Follow-Up Tests:  - Dietary history (gluten, lactose, FODMAPs).  - Detailed weight history (quantify muscle mass loss, unintentional weight loss).  - Screen for alarm features (occult blood, severe weight loss, persistent vomiting).  - Laboratory tests: CBC, metabolic panel, iron studies, A87, folate, celiac serologies.  - H. pylori testing (test-and-treat approach for dyspepsia).[1]  - Consider gastric emptying study if gastroparesis is  suspected.[2]  - Consider breath testing for SIBO if clinical suspicion remains.[3]  - Upper endoscopy if symptoms persist, worsen, or if alarm features develop.[1][6]  This approach prioritizes the most likely functional and motility disorders while remaining vigilant for less common but serious pathology.   Would you like me to review the evidence and recommendations for initial laboratory and non-invasive testing--specifically celiac serologies, H. pylori testing, and basic metabolic/nutritional panels--as the next step in evaluating chronic upper GI symptoms with muscle mass loss in this context? References Functional Dyspepsia: Evaluation and Management. Mounsey A, Barzin A, Rietz A. American Family Physician. 2020;101(2):84-88. Gastroparesis: A Turning Point in Understanding and Treatment. Claudean CHRISTELLA Isabelle KANDICE, Stanghellini V. Gut. 2019;68(12):2238-2250. doi:10.1136/gutjnl-2019-318712. ACG Clinical Guideline: Small Intestinal Bacterial Overgrowth. Laurian CHRISTELLA Roetta OK Darra MD, Melanee SSC. The American Journal of Gastroenterology. 2020;115(2):165-178. doi:10.14309/ajg.0000000000000501. Coeliac Disease. Catassi C, Verdu EF, Bai JC, Lionetti E. Lancet [place], [place]). 2022;399(10344):2413-2426. doi:10.1016/S0140-6736(22)00794-2. Irritable Bowel Syndrome. Primus GILDING, Sperber AD, Corsetti M, Camilleri M. Lancet [place], [place]). 2020;396(10263):1675-1688. doi:10.1016/S0140-6736(20)31548-8. Early-Onset Gastrointestinal Cancers. Jayakrishnan T, Ng K. JAMA. 7974;:7163328. doi:10.1001/jama.7974.89781. Evaluation and Management of Small-Bowel Obstruction: An Eastern Association for the Surgery of Trauma Practice Management Guideline. Maung AA, Johnson DC, Piper GL, et al. The Journal of Trauma and Acute Care Surgery. 7987;26(4 Suppl 4):S362-9. doi:10.1097/TA.[id number] de. Pancreatic Cancer. Stoop TF, Puryear AA, Oba A, et al. [place], [place]). 2025;405(10485):1182-1202.  doi:10.1016/S0140-6736(25)00261-2.   It is reasonable to trial metoclopramide for symptomatic relief in adults with suspected gastroparesis and chronic nausea prior to a gastric emptying study, provided there are no alarm features or contraindications, but it should be used with caution and for less than 12 weeks due to the risk of neurological side effects. The [employer] of  Gastroenterology and American Gastroenterological Association guidelines both support metoclopramide as initial pharmacologic therapy for gastroparesis, but emphasize the need for careful monitoring and shared decision-making regarding risks.[1][2]  The most significant risks associated with metoclopramide are tardive dyskinesia, extrapyramidal symptoms, sedation, depression, and neuroleptic malignant syndrome. These risks are low with short-term use (less than 12 weeks) and at recommended doses (10 mg up to four times daily), but increase with prolonged use, higher doses, and in older adults. The most common side effects are restlessness, drowsiness, and fatigue.[1][3][4][5] The risk of tardive dyskinesia is estimated to be less than 1%, and even lower with short-term use in patients under 65 years.[1]  Metoclopramide is not recommended solely for GERD and should be avoided in patients with mechanical obstruction, history of movement disorders, or concomitant use of antipsychotics. There are no significant drug interactions between metoclopramide and acetaminophen  or clobetasol at therapeutic doses, though metoclopramide may alter the absorption of some drugs by increasing GI motility.[4][6][7]  If symptoms persist or worsen, a 4-hour gastric emptying study is required for definitive diagnosis of gastroparesis. There is limited evidence regarding metoclopramide efficacy in patients with muscle mass loss and inconsistent bowel movements, so further evaluation may be needed if atypical features develop.[1][2]  In summary, a  short-term trial of metoclopramide is appropriate in this context, but requires careful monitoring for adverse effects and should be discontinued if side effects occur or if the diagnosis remains unclear.   Would you like me to review the latest evidence on the diagnostic accuracy and timing of gastric emptying studies in patients who have recently used prokinetic agents like metoclopramide, to ensure the test results are not confounded? References ACG Clinical Guideline: Gastroparesis. Camilleri M, Kuo B, Nguyen L, et al. The American Journal of Gastroenterology. 2022;117(8):1197-1220. doi:10.14309/ajg.0000000000001874. Pediatric Gastroesophageal Reflux Clinical Practice Guidelines: Joint Recommendations of the [facility] American Society for Pediatric Gastroenterology, Hepatology, and Nutrition and the European Society for Pediatric Gastroenterology, Hepatology, and Nutrition. Jesus JONELLE Percell CINDERELLA, Singendonk M, et al. Journal of Pediatric Gastroenterology and Nutrition. 2018;66(3):516-554. doi:10.1097/MPG.0000000000001889. AGA Clinical Practice Guideline on Management of Gastroparesis. Staller K, Parkman HP, Greer KB, et al. Gastroenterology. 2025;169(5):828-861. doi:10.1053/j.gastro.2025.08.004. Reglan. Food and Drug Administration. Updated date: 2018-10-11. ACG Clinical Guideline for the Diagnosis and Management of Gastroesophageal Reflux Disease. Micky PO, Deedra SKENE, Schnoll-Sussman FH, et al. The American Journal of Gastroenterology. 2022;117(1):27-56. doi:10.14309/ajg.0000000000001538. Drug Interactions With Paracetamol. Toes MJ, Jones AL, Prescott L. American Journal of Therapeutics. 2005 Jan-Feb;12(1):56-66. doi:10.1097/[id number]. GIMOTI. Food and Drug Administration. Updated date: 2019-05-01.   Clinical Context and Indications for SIBO Testing  In a 33 year old female with one month of constant nausea, intermittent cramping after eating, muscle mass loss due to fatigue,  inconsistent bowel movements, and a history of GERD, SIBO should be considered if symptoms are persistent, severe, or associated with malabsorption or refractory GI complaints. Careful patient selection is recommended, as indiscriminate testing can lead to overdiagnosis and unnecessary antibiotic use. Testing is most appropriate when symptoms are significant and other causes have been excluded, especially in the presence of risk factors such as motility disorders or prior abdominal surgery.[1][2][3]  Diagnostic Modalities for SIBO  The gold standard for SIBO diagnosis is small bowel aspirate and quantitative bacterial culture, but this is rarely performed due to its invasiveness, cost, and technical challenges.[1][2][4][5] In clinical practice, noninvasive breath testing--using glucose or lactulose as substrates--is most commonly employed. A positive test is defined as a rise in breath hydrogen of >=20 ppm above baseline within 90 minutes (for SIBO) or methane >=  10 ppm at any time (for intestinal methanogen overgrowth).[6][3] There is ongoing debate regarding the optimal substrate: glucose breath testing is preferred for specificity, while lactulose may be more sensitive but less specific.[6][7]  Preparation and Interpretation of Breath Tests  Patients should avoid antibiotics for at least 4 weeks, promotility agents and laxatives for 1 week, and follow dietary restrictions (low fermentable carbohydrate diet) the day before testing. Fasting for 8-12 hours prior to the test is required. During the test, serial breath samples are collected after ingestion of the substrate, and results are interpreted using the criteria above.[6]  Clinical Utility and Next Steps  A positive breath test supports a diagnosis of SIBO and may justify a trial of antibiotics, typically rifaximin or other broad-spectrum agents, though recurrence is common and treatment is not FDA-approved for this indication.[1][4] A negative test  suggests alternative diagnoses should be considered. Management should be individualized, and empiric therapy may be considered in select cases with high clinical suspicion.[1][4]  Areas Needing Further Evidence  There is a lack of standardization in breath test protocols and interpretation, and more robust clinical trials are needed to clarify optimal diagnostic and therapeutic strategies.[4][7][8]   Would you like me to summarize the latest evidence comparing the accuracy and clinical utility of glucose versus lactulose breath tests for SIBO, including recommendations from recent guidelines? This could help you select the most appropriate diagnostic modality for your patient. References AGA Clinical Practice Update on Evaluation and Management of Belching, Abdominal Bloating, and Distention: Expert Review. Moshiree B, Drossman D, Shaukat A. Gastroenterology. 2023;165(3):791-800.e3. doi:10.1053/j.gastro.2023.04.039. Small Intestinal Bacterial Overgrowth: Current Update. Zafar H, Jimenez B, Schneider A. Current Opinion in Gastroenterology. 2023;39(6):522-528. doi:10.1097/MOG.0000000000000971. Can Symptoms and Medical History Predict Outcomes for Hydrogen and Methane Breath Testing?SABRA Marylouise RENETTE Marsa GORMAN, Bloor S, et al. The American Journal of Gastroenterology. 2025;:[id number]. doi:10.14309/ajg.0000000000003697. AGA Clinical Practice Update on Small Intestinal Bacterial Overgrowth: Expert Review. Brain EMM, Jason SHILLING, [place]. Gastroenterology. 2020;159(4):1526-1532. doi:10.1053/j.gastro.2020.06.090. Diagnosis of Small Intestinal Bacterial Overgrowth in the Clinical Practice. Gabrielli M, D'Angelo G, Di Rienzo T, Scarpellini E, Ojetti V. European Review for Medical and [employer]. 2013;17 Suppl 2:30-5. ACG Clinical Guideline: Small Intestinal Bacterial Overgrowth. Laurian CHRISTELLA Roetta OK Darra MD, Melanee SSC. The American Journal of Gastroenterology. 2020;115(2):165-178.  doi:10.14309/ajg.0000000000000501. Small Intestinal Bacterial Overgrowth Syndrome: A Guide for the Appropriate Use of Breath Testing. Massey BT, Wald A. Digestive Diseases and Sciences. 2021;66(2):338-347. doi:10.1007/s10620-020-06623-6. How to Test and Treat Small Intestinal Bacterial Overgrowth: An Evidence-Based Approach. Audery DELENA Laurian CHRISTELLA Melanee LORRAINE. Current Gastroenterology Reports. 2016;18(2):8. doi:10.1007/s11894-(340)777-6043-9.   Patient Assessment and Indication for Antiemetic Trial  This 33 year old female presents with one month of constant nausea, intermittent postprandial cramping, muscle mass loss due to fatigue, inconsistent bowel movements, and a history of GERD. Reversible causes (e.g., pregnancy, medication side effects, severe constipation, infection) should be excluded before initiating antiemetic therapy. In the absence of alarm features or clear reversible etiology, a trial of antiemetics is reasonable for symptomatic relief and to prevent further nutritional compromise.[1][2]  Overview of Antiemetic Classes and Mechanisms  - Ondansetron  is a 5-HT3 receptor antagonist, effective for visceral and chemoreceptor-triggered nausea.  - Promethazine is an H1 antihistamine and phenothiazine, targeting histaminergic and dopaminergic pathways.  - Scopolamine is an anticholinergic, most effective for vestibular or motion-related nausea.  - Metoclopramide is a dopamine antagonist and prokinetic, useful for gastroparesis and upper GI motility disorders.[2][3]  Dosing and Administration Recommendations  - Ondansetron : 4-8 mg PO every 8 hours as needed. Use caution in hepatic  impairment and in patients with risk factors for QT prolongation; monitor ECG if indicated.[4]  - Promethazine: 12.5-25 mg PO or rectal every 4-6 hours as needed. Avoid parenteral administration due to risk of severe tissue injury.[5]  - Scopolamine patch: 1.5 mg transdermal patch applied behind the ear every 72  hours. Best for nausea with a vestibular component or excessive secretions.[2][5]  - Metoclopramide: 5-10 mg PO every 6-8 hours as needed. Limit use to less than 12 weeks to minimize risk of tardive dyskinesia; monitor for extrapyramidal symptoms.[2][5]  Monitoring and Safety Considerations  Monitor for CNS depression, sedation, fall risk, QT prolongation (ondansetron , metoclopramide), extrapyramidal/dystonic reactions (metoclopramide, promethazine), and anticholinergic side effects (scopolamine). Advise the patient to report new or worsening neurological symptoms, palpitations, or severe sedation. ECG monitoring is recommended for those with cardiac risk factors or on other QT-prolonging drugs.[4][5] Educate on signs of adverse effects and when to seek prompt medical attention.[1]  Nonpharmacologic and Adjunctive Strategies  Adjunctive measures include dietary modification, ginger, vitamin B6, and integrative therapies (e.g., acupressure).[1][2][6] These can be used alongside or prior to antiemetic medications to optimize symptom control and minimize medication exposure.  If symptoms persist or worsen, reassess for underlying causes and consider further diagnostic evaluation.   Would you like me to review the evidence for the comparative efficacy and safety of these antiemetics specifically in patients with chronic, unexplained nausea, to help guide which agent to prioritize for initial monotherapy and minimize polypharmacy? References Palliative Care. Occupational Hygienist. Updated 2024-02-18. AGA Clinical Practice Update on Management of Medically Refractory Gastroparesis: Expert Review. Lacy BE, Tack J, Gyawali CP. Clinical Gastroenterology and Hepatology : The Official Clinical Practice Journal of the American Gastroenterological Association. 2022;20(3):491-500. doi:10.1016/j.cgh.2021.10.038. Practical Selection of Antiemetics in the Ambulatory Setting. Flake ZA, Linn BS,  Hornecker JR. American Family Physician. 2015;91(5):293-6. Ondansetron  Hydrochloride. Food and Drug Administration. Updated date: 2016-06-08. Antiemesis. Occupational Hygienist. Updated 2023-09-03. AGA Clinical Practice Update on Pregnancy-Related Gastrointestinal and Liver Disease: Expert Review. Jodean GORMAN Allena CINDERELLA Saturnino TALBOT Venancio DOROTHA Gastroenterology. 2024;167(5):1033-1045. doi:10.1053/j.gastro.2024.06.014.   The following document provides practical, evidence-based guidance for a patient beginning a gluten-free diet trial, drawing on recent guidelines from the Academy of Nutrition and Dietetics and major reviews in celiac disease management. The recommendations emphasize strict gluten avoidance, nutritional adequacy, and the importance of monitoring and support, as supported by the medical literature.[1][2][3][4][5][6] Gluten-Free Diet Guidance  What is a gluten-free diet?   A gluten-free diet means avoiding all foods and ingredients that contain gluten, a protein found in wheat (including spelt, kamut, emmer, einkorn, triticale), rye, and barley. This includes common foods like bread, pasta, cereals, crackers, cookies, cakes, and many processed foods. Gluten can also be hidden in sauces, soups, and packaged foods, so careful label reading is essential.[1][2][5][6]  What foods are safe to eat?   You can enjoy naturally gluten-free grains and starches such as rice, corn, potatoes, buckwheat, millet, teff, amaranth, and quinoa. Most fruits, vegetables, meats, fish, eggs, dairy, nuts, and legumes are naturally gluten-free. Oats are usually safe if labeled "gluten-free," but some people may react to certain oat types or if oats are contaminated during processing. It's best to introduce gluten-free oats only after symptoms improve.[1][2][6]  How to avoid gluten contamination:   - Always check food labels for gluten-containing ingredients.  - Look for products labeled "gluten-free"  (less than 20 parts per million gluten).  - Be aware of cross-contact: gluten can get into gluten-free foods through shared equipment or surfaces.  -  When eating out, ask about food preparation and ingredients.  Nutritional considerations:   A gluten-free diet can sometimes be low in fiber, iron, calcium, folate, and other vitamins. Choose whole, nutrient-dense foods and consider a daily multivitamin if recommended by your healthcare team. Gluten-free processed foods may be higher in sugar and fat, so balance your diet with plenty of fruits, vegetables, lean proteins, and healthy fats.[2][3][6]  Tips for success:   - Plan meals and snacks ahead to avoid accidental gluten exposure.  - Learn to read labels and recognize hidden sources of gluten.  - Connect with support groups or online communities for recipes and advice.  - Work with a museum/gallery exhibitions officer for personalized nutrition guidance and to monitor your progress.[2][4][1]  Monitoring and follow-up:   Symptoms often improve within weeks, but full recovery can take months. Regular follow-up with your healthcare team is important to check for nutritional deficiencies and to support your adjustment to the diet. If symptoms persist, further evaluation may be needed.[1]  Common challenges:   Adhering to a gluten-free diet can be difficult, especially when eating out or traveling. It may affect your social life and emotional well-being. Support and education can help you adapt and maintain a healthy, balanced diet.[1][5][6]  Summary:   A gluten-free diet is the only proven treatment for celiac disease and may help with other gluten-related conditions. Strict avoidance of gluten is essential, and working with a dietitian can help ensure your diet is safe, nutritious, and sustainable.[2][3][4][6][1]   This guidance provides a clear, patient-focused approach to starting a gluten-free diet, emphasizing both strict gluten avoidance and  nutritional adequacy. Ongoing support from a dietitian and regular follow-up are key to long-term success and health maintenance.   Would you like me to summarize the evidence on how to monitor clinical and serological response during a gluten-free diet trial, including recommended timelines and laboratory tests for follow-up? References Coeliac Disease. Catassi C, Verdu EF, Bai JC, Lionetti E. Lancet Vick, [place]). 2022;399(10344):2413-2426. doi:10.1016/S0140-6736(22)00794-2. Celiac Disease: An Academy of Nutrition and Dietetics Evidence-Based Nutrition Practice Guideline. McDermid JM, Elysa KILLIAN, Henry NUTLEY, et al. Journal of the Academy of Nutrition and Dietetics. 2023;123(12):1793-1807.e4. doi:10.1016/j.jand.2023.07.018. Nutritional Considerations in Celiac Disease and Non-Celiac Gluten/Wheat Sensitivity. [place] JULIANNA Jacqulynn GORMAN Jackalyn RENETTE Raynaldo JONETTA, [place]. Nutrients. 2023;15(6):1475. doi:10.3390/nu15061475. Celiac Disease and Nonceliac Gluten Sensitivity: A Review. Rodgers MM, Sapone A, Catassi C, Fasano A. JAMA. 2017;318(7):647-656. doi:10.1001/jama.7982.0269. Celiac Disease. Fasano A, Catassi C. The [place] Journal of Medicine. 2012;367(25):2419-26. doi:10.1056/NEJMcp1113994. Celiac Disease. Green PH, Cellier C. The [place] Journal of Medicine. 2007;357(17):1731-43. doi:10.1056/NEJMra071600.   The following document provides practical, evidence-based guidance for a patient beginning a gluten-free diet trial, drawing on recent guidelines from the Academy of Nutrition and Dietetics and major reviews in celiac disease management. The recommendations emphasize strict gluten avoidance, nutritional adequacy, and the importance of monitoring and support, as supported by the medical literature.[1][2][3][4][5][6] Gluten-Free Diet Guidance  What is a gluten-free diet?   A gluten-free diet means avoiding all foods and ingredients that contain gluten, a protein found in wheat (including  spelt, kamut, emmer, einkorn, triticale), rye, and barley. This includes common foods like bread, pasta, cereals, crackers, cookies, cakes, and many processed foods. Gluten can also be hidden in sauces, soups, and packaged foods, so careful label reading is essential.[1][2][5][6]  What foods are safe to eat?   You can enjoy naturally gluten-free grains and starches such as rice, corn, potatoes, buckwheat, millet, teff, amaranth, and quinoa. Most fruits, vegetables, meats, fish,  eggs, dairy, nuts, and legumes are naturally gluten-free. Oats are usually safe if labeled "gluten-free," but some people may react to certain oat types or if oats are contaminated during processing. It's best to introduce gluten-free oats only after symptoms improve.[1][2][6]  How to avoid gluten contamination:   - Always check food labels for gluten-containing ingredients.  - Look for products labeled "gluten-free" (less than 20 parts per million gluten).  - Be aware of cross-contact: gluten can get into gluten-free foods through shared equipment or surfaces.  - When eating out, ask about food preparation and ingredients.  Nutritional considerations:   A gluten-free diet can sometimes be low in fiber, iron, calcium, folate, and other vitamins. Choose whole, nutrient-dense foods and consider a daily multivitamin if recommended by your healthcare team. Gluten-free processed foods may be higher in sugar and fat, so balance your diet with plenty of fruits, vegetables, lean proteins, and healthy fats.[2][3][6]  Tips for success:   - Plan meals and snacks ahead to avoid accidental gluten exposure.  - Learn to read labels and recognize hidden sources of gluten.  - Connect with support groups or online communities for recipes and advice.  - Work with a museum/gallery exhibitions officer for personalized nutrition guidance and to monitor your progress.[2][4][1]  Monitoring and follow-up:   Symptoms often improve within weeks, but  full recovery can take months. Regular follow-up with your healthcare team is important to check for nutritional deficiencies and to support your adjustment to the diet. If symptoms persist, further evaluation may be needed.[1]  Common challenges:   Adhering to a gluten-free diet can be difficult, especially when eating out or traveling. It may affect your social life and emotional well-being. Support and education can help you adapt and maintain a healthy, balanced diet.[1][5][6]  Summary:   A gluten-free diet is the only proven treatment for celiac disease and may help with other gluten-related conditions. Strict avoidance of gluten is essential, and working with a dietitian can help ensure your diet is safe, nutritious, and sustainable.[2][3][4][6][1]   This guidance provides a clear, patient-focused approach to starting a gluten-free diet, emphasizing both strict gluten avoidance and nutritional adequacy. Ongoing support from a dietitian and regular follow-up are key to long-term success and health maintenance.   Would you like me to summarize the evidence on how to monitor clinical and serological response during a gluten-free diet trial, including recommended timelines and laboratory tests for follow-up? References Coeliac Disease. Catassi C, Verdu EF, Bai JC, Lionetti E. Lancet Vick, [place]). 2022;399(10344):2413-2426. doi:10.1016/S0140-6736(22)00794-2. Celiac Disease: An Academy of Nutrition and Dietetics Evidence-Based Nutrition Practice Guideline. McDermid JM, Elysa KILLIAN, Henry NUTLEY, et al. Journal of the Academy of Nutrition and Dietetics. 2023;123(12):1793-1807.e4. doi:10.1016/j.jand.2023.07.018. Nutritional Considerations in Celiac Disease and Non-Celiac Gluten/Wheat Sensitivity. [place] JULIANNA Jacqulynn GORMAN Jackalyn RENETTE Raynaldo JONETTA, [place]. Nutrients. 2023;15(6):1475. doi:10.3390/[id number]. Celiac Disease and Nonceliac Gluten Sensitivity: A Review. Rodgers MM, Sapone A, Catassi C,  Fasano A. JAMA. 2017;318(7):647-656. doi:10.1001/jama.7982.0269. Celiac Disease. Fasano A, Catassi C. The [place] Journal of Medicine. 2012;367(25):2419-26. doi:10.1056/NEJMcp1113994. Celiac Disease. Green PH, Cellier C. The [place] Journal of Medicine. 2007;357(17):1731-43. doi:10.1056/NEJMra071600.

## 2024-02-22 NOTE — Progress Notes (Signed)
 ==============================  [facility]: [phone]   -- Medical Office Visit --  Patient: Alicia Elliott      Age: 33 y.o.       Sex:  female  Date:   02/22/2024 Today's Healthcare Provider: Bernardino KANDICE Cone, MD  ==============================   Chief Complaint: Nausea (Has been having nausea since 10/19. Took a pregnancy test and it was negative. Wondering if it could be hormonal because see had her nexplanon taken out 07/16. )  Discussed the use of AI scribe software for clinical note transcription with the patient, who gave verbal consent to proceed. History of Present Illness  33 year old female presenting with a chief complaint of persistent nausea and fatigue, ongoing for approximately two weeks. She describes her nausea as constant and unremitting, not linked to any particular activity, food, or time of day. The sensation is most prominent as a vague discomfort or queasiness beneath her jaw, which she likens to the premonitory feeling of impending emesis, though she has not vomited during this episode. She notes that she is generally not prone to nausea or vomiting, even during previous pregnancies, and reports this experience as highly atypical for her. Symptom Characterization and Associated Features: Nausea: Present throughout the day, regardless of position, activity, or dietary intake. The feeling is described as a persistent pressure or discomfort under the jaw, with occasional intensification after meals, but never progressing to actual vomiting. She has, on occasion, gone to the bathroom expecting to vomit but has not been able to do so. Bowel Habits: Bowel movements occur regularly, ranging from once to three times per day. She notes some variation in stool consistency, describing her stools as generally soft but not diarrheal, and not hard or constipated. She likens the consistency to something between a Snickers bar and a softer form,  but without frank diarrhea or significant constipation. She denies any blood, melena, or mucous in the stool. Abdominal Symptoms: After consuming larger meals, she experiences a sensation of "big lumps" or palpable fullness in her gut, which she can feel moving when lying down. This sensation is transient, resolving by the next morning, and is not associated with severe pain. She also occasionally experiences mild cramping post-prandially, which she suspects may be related to eating less frequently due to her nausea. Other GI Symptoms: She denies dysphagia, odynophagia, heartburn, or food impaction. No history of ulcers or prior significant GI disorders, except for a single episode of heartburn postpartum, which resolved after an endoscopic diagnosis of acid reflux. Fatigue: She reports marked fatigue, significantly greater than baseline, which she attributes in part to poor sleep and the chronic nausea. She notes a reduction in physical activity, specifically weightlifting, leading to perceived loss of muscle mass and possible mild weight loss, although she is unsure of the exact amount. She does not report overt anorexia but has reduced food and coffee intake. Polyuria/Polydipsia: She describes increased thirst and water consumption, which she believes is partially driven by her nausea. She has noticed increased urinary frequency but attributes this to her increased fluid intake. She denies dysuria, hematuria, or flank pain. Menstrual History: Nexplanon contraceptive implant was removed in July; since then, her periods have been slightly irregular but continue to occur monthly. She is unsure if her symptoms are related to hormonal changes. Other Systemic Symptoms: She denies fever, chills, headache, dizziness, visual changes, or other neurologic symptoms. No recent travel, new dietary changes, or sick contacts. She continues to consume gluten and lactose-containing foods  without noted exacerbation. Vape  pen: Occasional use of a vape pen for approximately one to two years. She notes that use of  it sometimes alleviates her nausea, but recent abstention did not worsen or improve her symptoms, suggesting minimal impact. No history of tobacco, alcohol, or illicit drug use. Functional Impact: She reports that her symptoms have significantly impacted her daily life, leading to decreased participation in exercise and social activities. Fatigue and poor sleep have further compounded her sense of malaise. Family History: Her father had diabetes (now resolved). She recalls an unclear history of gastrointestinal cancer in a grandparent but is unsure of the details. No known family history of inflammatory bowel disease, ulcers, or colon pathology. Past Medical/Surgical History: Aside from the aforementioned postpartum endoscopy, she has no history of abdominal surgeries. No prior diagnosis of diabetes, gastroparesis, or functional GI disorders. Review of Systems: GI: As above. No hematemesis, melena, or significant abdominal pain. No diarrhea or constipation. No jaundice or dark urine. GU: Increased urination, no dysuria, hematuria, or urgency. Constitutional: Fatigue, poor sleep, possible mild unintentional weight loss (primarily muscle mass). Neurologic: No headache, dizziness, or visual changes. Respiratory/Cardiac: No chest pain, palpitations, dyspnea, cough, or wheezing. Dermatologic: No rashes or pruritus. Patient's Perspective and Goals: She is seeking relief from her persistent nausea and fatigue, as these symptoms are interfering with her quality of life and ability to maintain her usual level of physical activity. She is open to dietary modifications and medication trials, and expresses concern about potential underlying causes, including diabetes, gastroparesis, and gluten sensitivity. She is interested in pursuing a gluten-free trial and is receptive to guidance regarding medication use and further  diagnostic evaluation. Background Reviewed: Problem List: has Gastroesophageal reflux disease on their problem list. Past Medical History:  has a past medical history of Alcoholism /alcohol abuse, Allergy, Substance abuse (HCC), and Vaginal delivery (04/24/2017, 11/02/2018). Past Surgical History:   has a past surgical history that includes Tonsillectomy; Wisdom tooth extraction; Cyst excision; and Hernia repair (Right, 2006). Social History:   reports that she has quit smoking. Her smoking use included cigarettes. She has never used smokeless tobacco. She reports current alcohol use. She reports that she does not currently use drugs. Family History:  family history includes Diverticulitis in her father. Allergies:  has no known allergies.   Medication Reconciliation: Current Outpatient Medications on File Prior to Visit  Medication Sig   acetaminophen  (TYLENOL ) 325 MG tablet Take 2 tablets (650 mg total) by mouth every 6 (six) hours as needed (for pain scale < 4).   clobetasol (OLUX) 0.05 % topical foam Apply topically 2 (two) times daily as needed.   No current facility-administered medications on file prior to visit.   Medications Discontinued During This Encounter  Medication Reason   etonogestrel (NEXPLANON) 68 MG IMPL implant Completed Course     Physical Exam:    02/22/2024    1:01 PM 03/20/2023    2:49 PM 03/20/2023   10:24 AM  Vitals with BMI  Height 5' 7 5' 7 5' 7  Weight 134 lbs 10 oz 143 lbs 143 lbs 6 oz  BMI 21.08 22.39 22.45  Systolic 106 122 889  Diastolic 62 82 70  Pulse 87 68 78  Vital signs reviewed.  Nursing notes reviewed. Weight trend reviewed. Physical Activity: Unknown (11/01/2018)   Exercise Vital Sign    Days of Exercise per Week: Patient declined    Minutes of Exercise per Session: Patient declined   General Appearance:  No acute  distress appreciable.   Well-groomed, healthy-appearing female.  Well proportioned with no abnormal fat distribution.   Good muscle tone. Pulmonary:  Normal work of breathing at rest, no respiratory distress apparent. SpO2: 98 %  Musculoskeletal: All extremities are intact.  Neurological:  Awake, alert, oriented, and engaged.  No obvious focal neurological deficits or cognitive impairments.  Sensorium seems unclouded.   Speech is clear and coherent with logical content. Psychiatric:  Appropriate mood, pleasant and cooperative demeanor, thoughtful and engaged during the exam      02/22/2024    1:49 PM 03/20/2023   10:27 AM 03/22/2022   12:52 PM 07/13/2020    8:03 AM  PHQ 2/9 Scores  PHQ - 2 Score 1 0 0 0  PHQ- 9 Score 11 5      {   Results for orders placed or performed in visit on 02/22/24  CBC with Differential/Platelet  Result Value Ref Range   WBC 5.0 4.0 - 10.5 K/uL   RBC 4.47 3.87 - 5.11 Mil/uL   Hemoglobin 13.7 12.0 - 15.0 g/dL   HCT 59.1 63.9 - 53.9 %   MCV 91.1 78.0 - 100.0 fl   MCHC 33.5 30.0 - 36.0 g/dL   RDW 87.8 88.4 - 84.4 %   Platelets 257.0 150.0 - 400.0 K/uL   Neutrophils Relative % 50.6 43.0 - 77.0 %   Lymphocytes Relative 40.4 12.0 - 46.0 %   Monocytes Relative 7.2 3.0 - 12.0 %   Eosinophils Relative 1.2 0.0 - 5.0 %   Basophils Relative 0.6 0.0 - 3.0 %   Neutro Abs 2.5 1.4 - 7.7 K/uL   Lymphs Abs 2.0 0.7 - 4.0 K/uL   Monocytes Absolute 0.4 0.1 - 1.0 K/uL   Eosinophils Absolute 0.1 0.0 - 0.7 K/uL   Basophils Absolute 0.0 0.0 - 0.1 K/uL  Comp Met (CMET)  Result Value Ref Range   Sodium 138 135 - 145 mEq/L   Potassium 4.5 3.5 - 5.1 mEq/L   Chloride 102 96 - 112 mEq/L   CO2 28 19 - 32 mEq/L   Glucose, Bld 75 70 - 99 mg/dL   BUN 10 6 - 23 mg/dL   Creatinine, Ser 9.10 0.40 - 1.20 mg/dL   Total Bilirubin 0.8 0.2 - 1.2 mg/dL   Alkaline Phosphatase 39 39 - 117 U/L   AST 14 0 - 37 U/L   ALT 10 0 - 35 U/L   Total Protein 7.6 6.0 - 8.3 g/dL   Albumin 5.2 3.5 - 5.2 g/dL   GFR 15.47 >39.99 mL/min   Calcium 9.9 8.4 - 10.5 mg/dL  Amylase  Result Value Ref Range   Amylase  35 27 - 131 U/L  HgB A1c  Result Value Ref Range   Hgb A1c MFr Bld 5.3 4.6 - 6.5 %  } Office Visit on 02/22/2024  Component Date Value Ref Range Status   WBC 02/22/2024 5.0  4.0 - 10.5 K/uL Final   RBC 02/22/2024 4.47  3.87 - 5.11 Mil/uL Final   Hemoglobin 02/22/2024 13.7  12.0 - 15.0 g/dL Final   HCT 89/68/7974 40.8  36.0 - 46.0 % Final   MCV 02/22/2024 91.1  78.0 - 100.0 fl Final   MCHC 02/22/2024 33.5  30.0 - 36.0 g/dL Final   RDW 89/68/7974 12.1  11.5 - 15.5 % Final   Platelets 02/22/2024 257.0  150.0 - 400.0 K/uL Final   Neutrophils Relative % 02/22/2024 50.6  43.0 - 77.0 % Final   Lymphocytes Relative 02/22/2024  40.4  12.0 - 46.0 % Final   Monocytes Relative 02/22/2024 7.2  3.0 - 12.0 % Final   Eosinophils Relative 02/22/2024 1.2  0.0 - 5.0 % Final   Basophils Relative 02/22/2024 0.6  0.0 - 3.0 % Final   Neutro Abs 02/22/2024 2.5  1.4 - 7.7 K/uL Final   Lymphs Abs 02/22/2024 2.0  0.7 - 4.0 K/uL Final   Monocytes Absolute 02/22/2024 0.4  0.1 - 1.0 K/uL Final   Eosinophils Absolute 02/22/2024 0.1  0.0 - 0.7 K/uL Final   Basophils Absolute 02/22/2024 0.0  0.0 - 0.1 K/uL Final   Sodium 02/22/2024 138  135 - 145 mEq/L Final   Potassium 02/22/2024 4.5  3.5 - 5.1 mEq/L Final   Chloride 02/22/2024 102  96 - 112 mEq/L Final   CO2 02/22/2024 28  19 - 32 mEq/L Final   Glucose, Bld 02/22/2024 75  70 - 99 mg/dL Final   BUN 89/68/7974 10  6 - 23 mg/dL Final   Creatinine, Ser 02/22/2024 0.89  0.40 - 1.20 mg/dL Final   Total Bilirubin 02/22/2024 0.8  0.2 - 1.2 mg/dL Final   Alkaline Phosphatase 02/22/2024 39  39 - 117 U/L Final   AST 02/22/2024 14  0 - 37 U/L Final   ALT 02/22/2024 10  0 - 35 U/L Final   Total Protein 02/22/2024 7.6  6.0 - 8.3 g/dL Final   Albumin 89/68/7974 5.2  3.5 - 5.2 g/dL Final   GFR 89/68/7974 84.52  >60.00 mL/min Final   Calcium 02/22/2024 9.9  8.4 - 10.5 mg/dL Final   Amylase 89/68/7974 35  27 - 131 U/L Final   Hgb A1c MFr Bld 02/22/2024 5.3  4.6 - 6.5 % Final   Office Visit on 03/20/2023  Component Date Value Ref Range Status   WBC 03/20/2023 5.5  4.0 - 10.5 K/uL Final   RBC 03/20/2023 4.19  3.87 - 5.11 Mil/uL Final   Hemoglobin 03/20/2023 13.2  12.0 - 15.0 g/dL Final   HCT 88/73/7975 39.4  36.0 - 46.0 % Final   MCV 03/20/2023 94.0  78.0 - 100.0 fl Final   MCHC 03/20/2023 33.6  30.0 - 36.0 g/dL Final   RDW 88/73/7975 12.0  11.5 - 15.5 % Final   Platelets 03/20/2023 294.0  150.0 - 400.0 K/uL Final   Neutrophils Relative % 03/20/2023 60.2  43.0 - 77.0 % Final   Lymphocytes Relative 03/20/2023 32.2  12.0 - 46.0 % Final   Monocytes Relative 03/20/2023 5.1  3.0 - 12.0 % Final   Eosinophils Relative 03/20/2023 1.8  0.0 - 5.0 % Final   Basophils Relative 03/20/2023 0.7  0.0 - 3.0 % Final   Neutro Abs 03/20/2023 3.3  1.4 - 7.7 K/uL Final   Lymphs Abs 03/20/2023 1.8  0.7 - 4.0 K/uL Final   Monocytes Absolute 03/20/2023 0.3  0.1 - 1.0 K/uL Final   Eosinophils Absolute 03/20/2023 0.1  0.0 - 0.7 K/uL Final   Basophils Absolute 03/20/2023 0.0  0.0 - 0.1 K/uL Final   Sodium 03/20/2023 138  135 - 145 mEq/L Final   Potassium 03/20/2023 3.7  3.5 - 5.1 mEq/L Final   Chloride 03/20/2023 103  96 - 112 mEq/L Final   CO2 03/20/2023 28  19 - 32 mEq/L Final   Glucose, Bld 03/20/2023 82  70 - 99 mg/dL Final   BUN 88/73/7975 10  6 - 23 mg/dL Final   Creatinine, Ser 03/20/2023 0.87  0.40 - 1.20 mg/dL Final   Total  Bilirubin 03/20/2023 0.7  0.2 - 1.2 mg/dL Final   Alkaline Phosphatase 03/20/2023 36 (L)  39 - 117 U/L Final   AST 03/20/2023 14  0 - 37 U/L Final   ALT 03/20/2023 9  0 - 35 U/L Final   Total Protein 03/20/2023 7.2  6.0 - 8.3 g/dL Final   Albumin 88/73/7975 4.7  3.5 - 5.2 g/dL Final   GFR 88/73/7975 87.42  >60.00 mL/min Final   Calcium 03/20/2023 9.4  8.4 - 10.5 mg/dL Final   TSH 88/73/7975 1.30  0.35 - 5.50 uIU/mL Final   Preg Test, Ur 03/20/2023 Negative  Negative Final  Office Visit on 03/22/2022  Component Date Value Ref Range Status   SARS  Coronavirus 2 Ag 03/22/2022 Negative  Negative Final  No image results found. No results found.       ASSESSMENT & PLAN    Assessment & Plan Nausea Polyuria Polydipsia Intestinal cramps  Clinical Impression: The patient's presentation is suggestive of a chronic, functional gastrointestinal disorder, with consideration of gastroparesis (possibly secondary to diabetes), small intestinal bacterial overgrowth (SIBO), and functional dyspepsia. Other considerations include early diabetes mellitus, gluten sensitivity, and far less likely, occult malignancy (no red flags). Initial workup will include laboratory evaluation for diabetes, basic chemistries, urinalysis, and empiric trials of antiemetic therapy and gluten elimination.  Chronic Nausea with Abdominal Cramping, Altered Bowel Habits, and Fatigue   She experiences chronic nausea with abdominal cramping, altered bowel habits, and fatigue, without vomiting. Symptoms are not linked to specific foods or times. Differential diagnoses include gastroparesis, SIBO, and functional dyspepsia. Gastroparesis is considered due to possible new-onset diabetes mellitus. SIBO is considered but testing is costly and unreliable. Functional dyspepsia is a diagnosis of exclusion. Prescribe Reglan, Zofran , Phenergan, and a scopolamine patch for nausea. Discuss potential side effects and diagnostic value of medications, including tardive dyskinesia risk with long-term Reglan use and sedation with the scopolamine patch. Order basic blood work and urine analysis to rule out UTI and kidney stones.  Polyuria and Polydipsia, Possible New-Onset Diabetes Mellitus   Increased urination and fluid intake suggest possible new-onset diabetes mellitus, supported by symptoms and family history. Gastroparesis is considered as a symptom of diabetes. Discuss potential autoimmune etiology for type 1 diabetes. Order lab work to check for diabetes mellitus.  Unintentional Weight Loss    Possible unintentional weight loss may be due to decreased muscle mass from fatigue and reduced physical activity. No significant weight loss noted. Fatigue is possibly related to chronic nausea and altered nutrition. Wt Readings from Last 50 Encounters:  02/22/24 134 lb 9.6 oz (61.1 kg)  03/20/23 143 lb (64.9 kg)  03/20/23 143 lb 6.1 oz (65 kg)  03/22/22 142 lb 6.4 oz (64.6 kg)  07/15/21 137 lb 6.4 oz (62.3 kg)  07/13/20 139 lb 6.1 oz (63.2 kg)  05/25/20 135 lb 8 oz (61.5 kg)  05/14/20 137 lb (62.1 kg)  02/16/20 140 lb (63.5 kg)  12/16/19 137 lb 8 oz (62.4 kg)  11/01/18 174 lb (78.9 kg)  12/27/17 135 lb (61.2 kg)  12/21/17 135 lb 12.8 oz (61.6 kg)  10/03/17 137 lb 12.8 oz (62.5 kg)  04/24/17 169 lb (76.7 kg)   Suspected Gluten Sensitivity   A trial of a gluten-free diet is recommended to rule out gluten sensitivity as a cause of symptoms. Discuss the prevalence of gluten sensitivity and benefits of a gluten-free diet. Provide guidance on identifying gluten-containing foods. Initiate a two-week gluten-free diet trial. Recording duration: 32 minutes  Summary of Key Points for Differential Diagnosis: Chronic, non-specific nausea and fatigue, not relieved by dietary or activity changes. Mild abdominal cramping and post-prandial fullness, with transient "lumps" in the abdomen after large meals. Increased thirst and urination, raising concern for possible new-onset diabetes mellitus. No vomiting, diarrhea, or significant weight loss; possible mild muscle mass reduction due to decreased activity. No clear association with THC use, menstrual irregularities, or dietary triggers. Family history notable for diabetes and possible GI malignancy.     ORDER ASSOCIATIONS  #   DIAGNOSIS / CONDITION ICD-10 ENCOUNTER ORDER     ICD-10-CM   1. Nausea  R11.0 metoCLOPramide (REGLAN) 5 MG tablet    Small Intestinal Bacterial Overgrowth (SIBO)    CBC with Differential/Platelet    Comp Met (CMET)     Amylase    HgB A1c    Urinalysis w microscopic + reflex cultur    promethazine-dextromethorphan (PROMETHAZINE-DM) 6.25-15 MG/5ML syrup    scopolamine (TRANSDERM-SCOP) 1 MG/3DAYS    Pregnancy, urine    2. Polyuria  R35.89 HgB A1c    3. Polydipsia  R63.1 HgB A1c    4. Intestinal cramps  R10.9 metoCLOPramide (REGLAN) 5 MG tablet    Small Intestinal Bacterial Overgrowth (SIBO)    CBC with Differential/Platelet    Comp Met (CMET)    Amylase    HgB A1c    Urinalysis w microscopic + reflex cultur    promethazine-dextromethorphan (PROMETHAZINE-DM) 6.25-15 MG/5ML syrup    Pregnancy, urine           Orders Placed in Encounter:   Lab Orders         Small Intestinal Bacterial Overgrowth (SIBO)         CBC with Differential/Platelet         Comp Met (CMET)         Amylase         HgB A1c         Urinalysis w microscopic + reflex cultur         Pregnancy, urine      Meds ordered this encounter  Medications   metoCLOPramide (REGLAN) 5 MG tablet    Sig: Take 1 tablet (5 mg total) by mouth 4 (four) times daily.    Dispense:  60 tablet    Refill:  0   ondansetron  (ZOFRAN ) 4 MG tablet    Sig: Take 1 tablet (4 mg total) by mouth every 8 (eight) hours as needed for nausea or vomiting.    Dispense:  60 tablet    Refill:  4   promethazine-dextromethorphan (PROMETHAZINE-DM) 6.25-15 MG/5ML syrup    Sig: Take 5 mLs by mouth 4 (four) times daily as needed for cough.    Dispense:  118 mL    Refill:  0   scopolamine (TRANSDERM-SCOP) 1 MG/3DAYS    Sig: Place 1 patch (1 mg total) onto the skin every 3 (three) days.    Dispense:  10 patch    Refill:  1       This document was synthesized by artificial intelligence ([employer]) using HIPAA-compliant recording of the clinical interaction;   We discussed the use of AI scribe software for clinical note transcription with the patient, who gave verbal consent to proceed. additional Info: This encounter employed state-of-the-art, real-time,  collaborative documentation. The patient actively reviewed and assisted in updating their electronic medical record on a shared screen, ensuring transparency and facilitating joint problem-solving for the problem list, overview, and plan. This approach promotes accurate, informed  care. The treatment plan was discussed and reviewed in detail, including medication safety, potential side effects, and all patient questions. We confirmed understanding and comfort with the plan. Follow-up instructions were established, including contacting the office for any concerns, returning if symptoms worsen, persist, or new symptoms develop, and precautions for potential emergency department visits.

## 2024-02-23 LAB — URINALYSIS W MICROSCOPIC + REFLEX CULTURE
Bacteria, UA: NONE SEEN /HPF
Bilirubin Urine: NEGATIVE
Glucose, UA: NEGATIVE
Hgb urine dipstick: NEGATIVE
Hyaline Cast: NONE SEEN /LPF
Leukocyte Esterase: NEGATIVE
Nitrites, Initial: NEGATIVE
RBC / HPF: NONE SEEN /HPF (ref 0–2)
Specific Gravity, Urine: 1.018 (ref 1.001–1.035)
Squamous Epithelial / HPF: NONE SEEN /HPF (ref <=5)
WBC, UA: NONE SEEN /HPF (ref 0–5)
pH: 5.5 (ref 5.0–8.0)

## 2024-02-23 LAB — PREGNANCY, URINE: Preg Test, Ur: NEGATIVE

## 2024-02-23 LAB — NO CULTURE INDICATED

## 2024-02-24 ENCOUNTER — Ambulatory Visit: Payer: Self-pay | Admitting: Internal Medicine

## 2024-04-07 DIAGNOSIS — N911 Secondary amenorrhea: Secondary | ICD-10-CM | POA: Diagnosis not present

## 2024-04-15 DIAGNOSIS — Z3481 Encounter for supervision of other normal pregnancy, first trimester: Secondary | ICD-10-CM | POA: Diagnosis not present

## 2024-04-15 DIAGNOSIS — Z3A11 11 weeks gestation of pregnancy: Secondary | ICD-10-CM | POA: Diagnosis not present
